# Patient Record
Sex: Female | Born: 2000 | Hispanic: No | Marital: Single | Smoking: Never smoker
Health system: Southern US, Community
[De-identification: ages and names within clinical notes are randomized; demographics above are authoritative.]

## PROBLEM LIST (undated history)

## (undated) DIAGNOSIS — J45909 Unspecified asthma, uncomplicated: Secondary | ICD-10-CM

## (undated) DIAGNOSIS — H9192 Unspecified hearing loss, left ear: Secondary | ICD-10-CM

## (undated) DIAGNOSIS — E669 Obesity, unspecified: Secondary | ICD-10-CM

## (undated) DIAGNOSIS — R51 Headache: Secondary | ICD-10-CM

## (undated) DIAGNOSIS — R519 Headache, unspecified: Secondary | ICD-10-CM

## (undated) DIAGNOSIS — J189 Pneumonia, unspecified organism: Secondary | ICD-10-CM

## (undated) DIAGNOSIS — S82899A Other fracture of unspecified lower leg, initial encounter for closed fracture: Secondary | ICD-10-CM

## (undated) HISTORY — PX: WISDOM TOOTH EXTRACTION: SHX21

---
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
## 2021-12-10 ENCOUNTER — Encounter (HOSPITAL_BASED_OUTPATIENT_CLINIC_OR_DEPARTMENT_OTHER): Payer: Self-pay

## 2021-12-10 ENCOUNTER — Emergency Department (HOSPITAL_BASED_OUTPATIENT_CLINIC_OR_DEPARTMENT_OTHER)
Admission: EM | Admit: 2021-12-10 | Discharge: 2021-12-10 | Disposition: A | Discharge status: Home / Self Care | Payer: Medicaid Other | Attending: Emergency Medicine | Admitting: Emergency Medicine

## 2021-12-10 ENCOUNTER — Other Ambulatory Visit: Payer: Self-pay

## 2021-12-10 DIAGNOSIS — L02412 Cutaneous abscess of left axilla: Secondary | ICD-10-CM | POA: Insufficient documentation

## 2021-12-10 DIAGNOSIS — L02411 Cutaneous abscess of right axilla: Secondary | ICD-10-CM

## 2021-12-10 MED ORDER — LIDOCAINE-EPINEPHRINE (PF) 2 %-1:200000 IJ SOLN
10.0000 mL | Freq: Once | INTRAMUSCULAR | Status: AC
  Administered 2021-12-10: 10 mL
  Filled 2021-12-10: qty 20

## 2021-12-10 MED ORDER — DOXYCYCLINE HYCLATE 100 MG PO CAPS: 100.0000 mg | ORAL_CAPSULE | Freq: Two times a day (BID) | ORAL | 0 refills | Status: DC

## 2021-12-10 NOTE — Discharge Instructions (Addendum)
You were seen in the emergency department for an abscess. ? ?I think you likely have a condition called hidradenitis suppurativa. I've attached some instructions about it. ? ?I am placing on a course of antibiotics, its important you take the entire course.  I have also sent a referral to the dermatologist, you should hear from them in the next several days.  If you do not, please give them a call and schedule an appointment yourself. ? ?You can take ibuprofen or Tylenol as needed for pain. ?

## 2021-12-10 NOTE — ED Provider Notes (Signed)
?[facility] EMERGENCY DEPARTMENT ?Provider Note ? ? ?CSN: [id number] ?Arrival date & time: 12/10/21  1707 ? ?  ? ?History ? ?Chief Complaint  ?Patient presents with  ? Abscess  ? ? ?Adriana Decker is a 21 y.o. female who presents the emergency department with axillary abscesses.  Patient states that she gets "boils" under both her armpits.  She notes the swelling under her left armpit is worse than the right, and was sent by her primary care doctor with concern for needing an incision and drainage.  She states that she has been having this boil for the past 3 days, and it has been draining.  No fever.  She states she has never seen a dermatologist for this. ? ? ?Abscess ?Associated symptoms: no fever   ? ?  ? ?Home Medications ?Prior to Admission medications   ?Medication Sig Start Date End Date Taking? Authorizing Provider  ?doxycycline (VIBRAMYCIN) 100 MG capsule Take 1 capsule (100 mg total) by mouth 2 (two) times daily. 12/10/21  Yes Carena Stream T, PA-C  ?acetaminophen (TYLENOL) 325 MG tablet Take 2 tablets (650 mg total) by mouth every 6 (six) hours as needed for mild pain or moderate pain. 11/21/16   Everlene Farrier, PA-C  ?aspirin EC 325 MG tablet Take 1 tablet (325 mg total) by mouth daily. 12/03/16   Yolonda Kida, MD  ?HYDROcodone-acetaminophen [facility]) 5-325 MG tablet Take 1-2 tablets by mouth every 6 (six) hours as needed for moderate pain. 12/03/16   Yolonda Kida, MD  ?naproxen (NAPROSYN) 250 MG tablet Take 1 tablet (250 mg total) by mouth 2 (two) times daily with a meal. 11/21/16   Everlene Farrier, PA-C  ?ondansetron (ZOFRAN ODT) 4 MG disintegrating tablet Take 1 tablet (4 mg total) by mouth every 8 (eight) hours as needed. 12/03/16   Yolonda Kida, MD  ?   ? ?Allergies    ?No known allergies   ? ?Review of Systems   ?Review of Systems  ?Constitutional:  Negative for fever.  ?Skin:   ?     Axillary abscesses  ?All other systems reviewed and are negative. ? ?Physical  Exam ?Updated Vital Signs ?BP 133/84   Pulse 76   Temp 98 ?F (36.7 ?C) (Oral)   Resp 16   Ht 5\' 5"  (1.651 m)   Wt 91.6 kg   LMP 12/03/2021 (Approximate)   SpO2 100%   BMI 33.61 kg/m?  ?Physical Exam ?Vitals and nursing note reviewed.  ?Constitutional:   ?   Appearance: Normal appearance.  ?HENT:  ?   Head: Normocephalic and atraumatic.  ?Eyes:  ?   Conjunctiva/sclera: Conjunctivae normal.  ?Pulmonary:  ?   Effort: Pulmonary effort is normal. No respiratory distress.  ?Skin: ?   General: Skin is warm and dry.  ?   Comments: Approximately 4 cm area of induration and fluctuance under the left axilla, with purulent drainage.  Approximately 6 cm area of induration under the right axilla without erythema, questionable for deeper abscess.  ?Neurological:  ?   Mental Status: She is alert.  ?Psychiatric:     ?   Mood and Affect: Mood normal.     ?   Behavior: Behavior normal.  ? ? ?ED Results / Procedures / Treatments   ?Labs ?(all labs ordered are listed, but only abnormal results are displayed) ?Labs Reviewed - No data to display ? ?EKG ?None ? ?Radiology ?No results found. ? ?Procedures ?12/05/2021.Incision and Drainage ? ?Date/Time: 12/10/2021 7:42 PM ?Performed  by: Su Monks, PA-C ?Authorized by: Su Monks, PA-C  ? ?Consent:  ?  Consent obtained:  Verbal ?  Consent given by:  Patient ?  Risks discussed:  Incomplete drainage, pain and infection ?Universal protocol:  ?  Patient identity confirmed:  Verbally with patient ?Location:  ?  Type:  Abscess ?  Size:  4 cm ?  Location:  Upper extremity ?  Upper extremity location: Axilla. ?Pre-procedure details:  ?  Skin preparation:  Povidone-iodine ?Anesthesia:  ?  Anesthesia method:  Local infiltration ?  Local anesthetic:  Lidocaine 2% WITH epi ?Procedure type:  ?  Complexity:  Simple ?Procedure details:  ?  Needle aspiration: yes   ?  Needle size:  25 G ?  Incision types:  Stab incision ?  Wound management:  Probed and deloculated and irrigated with saline ?   Drainage:  Bloody ?  Drainage amount:  Scant ?  Wound treatment:  Wound left open ?  Packing materials:  None ?Post-procedure details:  ?  Procedure completion:  Tolerated well, no immediate complications  ? ? ?Medications Ordered in ED ?Medications  ?lidocaine-EPINEPHrine (XYLOCAINE W/EPI) 2 %-1:200000 (PF) injection 10 mL (10 mLs Infiltration Given 12/10/21 1853)  ? ? ?ED Course/ Medical Decision Making/ A&P ?  ?                        ?Medical Decision Making ?Risk ?Prescription drug management. ? ? ?This patient is a 21 year old female who presents to the ED for concern of axillary abscesses.  ? ?Differential diagnoses prior to evaluation: ?Cellulitis, abscess, hidradenitis suppurativa ? ?Past Medical History / Co-morbidities / Social History: ?Noncontributory ? ?Additional history: ?Chart reviewed.  ? ?Physical Exam: ?Physical exam performed. The pertinent findings include: 4 cm area of induration and fluctuance under the left axilla, with purulent drainage ? ?Procedure: ?Abscess was drained, with scant bloody drainage.  Patient likely had most purulent drainage and the abscess opened on its own.  Patient tolerated procedure well with no immediate complications.  We will place patient on antibiotics. ?  ?Disposition: ?After consideration of the diagnostic results and the patients response to treatment, I feel that patient's current mission inpatient treatment.  Multiple abscesses consistent with hidradenitis suppurativa.  Will discharge patient on antibiotics and provide dermatology referral.  Provided education about wound care post I&D.  Discussed reasons to return to the emergency department, and the patient is agreeable to the plan.  ? ? ? ? ? ? ? ?Final Clinical Impression(s) / ED Diagnoses ?Final diagnoses:  ?Abscess of axilla, left  ?Abscesses of both axillae  ? ? ?Rx / DC Orders ?ED Discharge Orders   ? ?      Ordered  ?  doxycycline (VIBRAMYCIN) 100 MG capsule  2 times daily       ? 12/10/21 1934  ?   Ambulatory referral to Dermatology       ?Comments: Follow up I&D left axillary abscess, concerning for hidradenitis suppurativa  ? 12/10/21 1934  ? ?  ?  ? ?  ? ?Portions of this report may have been transcribed using voice recognition software. Every effort was made to ensure accuracy; however, inadvertent computerized transcription errors may be present. ? ?  ?Su Monks, PA-C ?12/10/21 1943 ? ?  ?Glynn Octave, MD ?12/10/21 2333 ? ?

## 2021-12-10 NOTE — ED Notes (Signed)
Abscess under left axilla, pt states she it has been there 3 days +drainage ?

## 2021-12-10 NOTE — ED Triage Notes (Signed)
States she gets "boils" under her armpits. Swelling noted under left axilla worse than right. Was sent by PCP. ?

## 2021-12-10 NOTE — ED Notes (Signed)
NIH documented on wrong client ?

## 2021-12-11 ENCOUNTER — Telehealth: Payer: Self-pay | Admitting: Dermatology

## 2021-12-11 NOTE — Telephone Encounter (Signed)
This referral was already routed back to referring office because patient has Medicaid. ?

## 2021-12-11 NOTE — Telephone Encounter (Signed)
Please ask referrer to try to get her in elsewhere before December ?

## 2023-06-18 ENCOUNTER — Other Ambulatory Visit: Payer: Self-pay

## 2023-06-18 ENCOUNTER — Ambulatory Visit (INDEPENDENT_AMBULATORY_CARE_PROVIDER_SITE_OTHER): Payer: Medicaid Other

## 2023-06-18 ENCOUNTER — Other Ambulatory Visit (HOSPITAL_COMMUNITY)
Admission: RE | Admit: 2023-06-18 | Discharge: 2023-06-18 | Disposition: A | Discharge status: Home / Self Care | Payer: Medicaid Other | Source: Ambulatory Visit | Attending: Obstetrics and Gynecology | Admitting: Obstetrics and Gynecology

## 2023-06-18 VITALS — BP 130/70 | HR 77 | Wt 217.4 lb

## 2023-06-18 DIAGNOSIS — Z3A10 10 weeks gestation of pregnancy: Secondary | ICD-10-CM

## 2023-06-18 DIAGNOSIS — F430 Acute stress reaction: Secondary | ICD-10-CM

## 2023-06-18 DIAGNOSIS — Z3401 Encounter for supervision of normal first pregnancy, first trimester: Secondary | ICD-10-CM | POA: Insufficient documentation

## 2023-06-18 DIAGNOSIS — Z1339 Encounter for screening examination for other mental health and behavioral disorders: Secondary | ICD-10-CM

## 2023-06-18 MED ORDER — GOJJI WEIGHT SCALE MISC: 1.0000 | 0 refills | Status: AC

## 2023-06-18 MED ORDER — BLOOD PRESSURE KIT DEVI: 1.0000 | 0 refills | Status: AC

## 2023-06-18 MED ORDER — CITRANATAL B-CALM 20-1 MG & 2 X 25 MG PO MISC: 1.0000 | Freq: Every day | ORAL | 12 refills | Status: DC

## 2023-06-18 NOTE — Progress Notes (Signed)
New OB Intake  I connected with Adriana Decker  on 06/18/23 at 10:15 AM EST by in person and verified that I am speaking with the correct person using two identifiers. Nurse is located at [facility] and pt is located at [place].  I discussed the limitations, risks, security and privacy concerns of performing an evaluation and management service by telephone and the availability of in person appointments. I also discussed with the patient that there may be a patient responsible charge related to this service. The patient expressed understanding and agreed to proceed.  I explained I am completing New OB Intake today. We discussed EDD 01/14/24 based on LMP 04/09/23.Marland Kitchen Pt is No obstetric history on file.. I reviewed her allergies, medications and Medical/Surgical/OB history.    Patient Active Problem List   Diagnosis Date Noted   Ankle syndesmosis disruption, right, initial encounter 06/22/2017   Bimalleolar ankle fracture, right, closed, initial encounter 12/03/2016   Closed right ankle fracture 12/03/2016    Concerns addressed today  Delivery Plans Plans to deliver at [facility]. Discussed the nature of our practice with multiple providers including residents and students. Due to the size of the practice, the delivering provider may not be the same as those providing prenatal care.   Patient is interested in water birth. Offered upcoming OB visit with CNM to discuss further.  MyChart/Babyscripts MyChart access verified. I explained pt will have some visits in office and some virtually. Babyscripts instructions given and order placed. Patient verifies receipt of registration text/e-mail. Account successfully created and app downloaded.  Blood Pressure Cuff/Weight Scale Blood pressure cuff ordered for patient to pick-up from [employer]. Explained after first prenatal appt pt will check weekly and document in Babyscripts. Patient does not have weight scale; order sent to [facility], patient  may track weight weekly in Babyscripts.  Anatomy US Explained first scheduled [place] will be around 19 weeks. Anatomy US scheduled for TBD.  Is patient a CenteringPregnancy candidate?  Declined Declined due to Group setting Not a candidate due to  Declined If accepted,    Interested in Doula? If yes, send referral and doula dot phrase.   Is patient a candidate for Babyscripts Optimization? No - Not interested  First visit review I reviewed new OB appt with patient. Explained pt will be seen by TBD at first visit. Discussed Avelina Laine genetic screening with patient. Interested in [place] [facility] [employer]. Drawn today. Routine prenatal labs is collected today   Last Pap No results found for: "DIAGPAP"  Hamilton Capri, RN 06/18/2023  10:26 AM

## 2023-06-19 LAB — CBC/D/PLT+RPR+RH+ABO+RUBIGG...
Antibody Screen: NEGATIVE
Basophils Absolute: 0.1 10*3/uL (ref 0.0–0.2)
Basos: 1 %
EOS (ABSOLUTE): 0.5 10*3/uL — ABNORMAL HIGH (ref 0.0–0.4)
Eos: 5 %
HCV Ab: NONREACTIVE
HIV Screen 4th Generation wRfx: NONREACTIVE
Hematocrit: 34.9 % (ref 34.0–46.6)
Hemoglobin: 10.2 g/dL — ABNORMAL LOW (ref 11.1–15.9)
Hepatitis B Surface Ag: NEGATIVE
Immature Grans (Abs): 0 10*3/uL (ref 0.0–0.1)
Immature Granulocytes: 0 %
Lymphocytes Absolute: 2.6 10*3/uL (ref 0.7–3.1)
Lymphs: 24 %
MCH: 20.8 pg — ABNORMAL LOW (ref 26.6–33.0)
MCHC: 29.2 g/dL — ABNORMAL LOW (ref 31.5–35.7)
MCV: 71 fL — ABNORMAL LOW (ref 79–97)
Monocytes Absolute: 0.5 10*3/uL (ref 0.1–0.9)
Monocytes: 5 %
Neutrophils Absolute: 7 10*3/uL (ref 1.4–7.0)
Neutrophils: 65 %
Platelets: 397 10*3/uL (ref 150–450)
RBC: 4.9 x10E6/uL (ref 3.77–5.28)
RDW: 18 % — ABNORMAL HIGH (ref 11.7–15.4)
RPR Ser Ql: NONREACTIVE
Rh Factor: POSITIVE
Rubella Antibodies, IGG: 2.55 {index} (ref >0.99)
WBC: 10.5 10*3/uL (ref 3.4–10.8)

## 2023-06-19 LAB — CERVICOVAGINAL ANCILLARY ONLY
Chlamydia: NEGATIVE
Comment: NEGATIVE
Comment: NEGATIVE
Comment: NORMAL
Neisseria Gonorrhea: NEGATIVE
Trichomonas: NEGATIVE

## 2023-06-19 LAB — HCV INTERPRETATION

## 2023-06-20 LAB — URINE CULTURE, OB REFLEX

## 2023-06-20 LAB — CULTURE, OB URINE

## 2023-06-27 LAB — HORIZON CUSTOM: REPORT SUMMARY: NEGATIVE

## 2023-06-27 LAB — PANORAMA PRENATAL TEST FULL PANEL:PANORAMA TEST PLUS 5 ADDITIONAL MICRODELETIONS: FETAL FRACTION: 2.7

## 2023-06-30 ENCOUNTER — Other Ambulatory Visit: Payer: Self-pay | Admitting: *Deleted

## 2023-06-30 DIAGNOSIS — Z3401 Encounter for supervision of normal first pregnancy, first trimester: Secondary | ICD-10-CM

## 2023-06-30 MED ORDER — PREPLUS 27-1 MG PO TABS: 1.0000 | ORAL_TABLET | Freq: Every day | ORAL | 13 refills | Status: DC

## 2023-06-30 NOTE — Progress Notes (Signed)
Pharmacy request alternate PNV, original RX not available. New RX sent.

## 2023-07-14 ENCOUNTER — Other Ambulatory Visit (HOSPITAL_COMMUNITY)
Admission: RE | Admit: 2023-07-14 | Discharge: 2023-07-14 | Disposition: A | Discharge status: Home / Self Care | Payer: Medicaid Other | Source: Ambulatory Visit | Attending: Family Medicine | Admitting: Family Medicine

## 2023-07-14 ENCOUNTER — Encounter: Payer: Self-pay | Admitting: Family Medicine

## 2023-07-14 ENCOUNTER — Ambulatory Visit: Payer: Medicaid Other | Admitting: Licensed Clinical Social Worker

## 2023-07-14 ENCOUNTER — Ambulatory Visit: Payer: Medicaid Other | Admitting: Family Medicine

## 2023-07-14 VITALS — BP 127/71 | HR 77 | Wt 216.0 lb

## 2023-07-14 DIAGNOSIS — F4322 Adjustment disorder with anxiety: Secondary | ICD-10-CM

## 2023-07-14 DIAGNOSIS — Z3A13 13 weeks gestation of pregnancy: Secondary | ICD-10-CM | POA: Diagnosis not present

## 2023-07-14 DIAGNOSIS — Z3401 Encounter for supervision of normal first pregnancy, first trimester: Secondary | ICD-10-CM | POA: Insufficient documentation

## 2023-07-14 DIAGNOSIS — Z3400 Encounter for supervision of normal first pregnancy, unspecified trimester: Secondary | ICD-10-CM

## 2023-07-14 DIAGNOSIS — Z6833 Body mass index (BMI) 33.0-33.9, adult: Secondary | ICD-10-CM

## 2023-07-14 DIAGNOSIS — O219 Vomiting of pregnancy, unspecified: Secondary | ICD-10-CM

## 2023-07-14 MED ORDER — ASPIRIN 81 MG PO TBEC: 81.0000 mg | DELAYED_RELEASE_TABLET | Freq: Every day | ORAL | 12 refills | Status: DC

## 2023-07-14 MED ORDER — ONDANSETRON HCL 4 MG PO TABS: 4.0000 mg | ORAL_TABLET | Freq: Three times a day (TID) | ORAL | 0 refills | Status: AC | PRN

## 2023-07-14 NOTE — Progress Notes (Signed)
Pt presents for ROB visit. No PAP Hx.

## 2023-07-14 NOTE — Progress Notes (Signed)
New OB Note  07/14/2023   Clinic: [facility]  Transfer of Care Patient: no  History of Present Illness: Adriana Decker is a 22 y.o. G1P0 at [redacted]w[redacted]d weeks (dating based on LMP).  Preg complicated by has Encounter for supervision of normal first pregnancy in first trimester on their problem list.   Support at home: Partner Any events prior to today's visit: no Her periods were: irregular periods -- would be monthly, then would switch timing of the month She was using condoms when she conceived.  She has Positive signs or symptoms of nausea/vomiting of pregnancy. She has Negative signs or symptoms of miscarriage or preterm labor On any medications around the time she conceived/early pregnancy: No   ROS: A 12-point review of systems was performed and negative, except as stated in the above HPI.  OB History  Gravida Para Term Preterm AB Living  1            SAB IAB Ectopic Multiple Live Births               # Outcome Date GA Lbr Len/2nd Weight Sex Type Anes PTL Lv  1 Current             Any issues with any prior pregnancies: not applicable Prior children are healthy, doing well, and without any problems or issues: not applicable History of pap smears: No. Never had pap before. History of STIs: No   Past Medical History: Past Medical History:  Diagnosis Date   Ankle fracture    right   Asthma    " as a baby"   Headache    Hearing deficit, left    Obesity    Pneumonia    " as a baby"    Past Surgical History: Past Surgical History:  Procedure Laterality Date   ORIF ANKLE FRACTURE Right 12/03/2016   [facility]   ORIF ANKLE FRACTURE Right 12/03/2016   Procedure: OPEN REDUCTION INTERNAL FIXATION (ORIF) ANKLE FRACTURE;  Surgeon: Yolonda Kida, MD;  Location: [facility] OR;  Service: Orthopedics;  Laterality: Right;   WISDOM TOOTH EXTRACTION      Family History:  Family History  Problem Relation Age of Onset   High blood pressure Mother    Asthma Brother    Asthma  Brother    Hypertension Maternal Grandmother    Hypertension Maternal Grandfather    Hypertension Paternal Grandmother    Asthma Paternal Grandmother    She denies any female cancers, bleeding or blood clotting disorders.  She denies any history of mental retardation, birth defects or genetic disorders in her or the FOB's history  Social History:  Social History   Socioeconomic History   Marital status: Single    Spouse name: Not on file   Number of children: Not on file   Years of education: Not on file   Highest education level: Not on file  Occupational History   Not on file  Tobacco Use   Smoking status: Never   Smokeless tobacco: Never  Vaping Use   Vaping status: Former   Devices: not since confirmed pregnancy  Substance and Sexual Activity   Alcohol use: Not Currently    Comment: not since confirmed pregnancy   Drug use: No   Sexual activity: Yes    Partners: Male    Comment: currently pregnant  Other Topics Concern   Not on file  Social History Narrative   Pt is in the 10 th grade and lives at home with  both parents and siblings.   Patient has 67 yo brother amd 30 yo brother. No smokers in the home. 1 cat sometimes stays in the home.   Social Determinants of Health   Financial Resource Strain: Not on file  Food Insecurity: Not on file  Transportation Needs: Not on file  Physical Activity: Not on file  Stress: Not on file  Social Connections: Unknown (12/25/2021)   Received from [facility]   Social Network    Social Network: Not on file  Intimate Partner Violence: Unknown (11/16/2021)   Received from [facility]   HITS    Physically Hurt: Not on file    Insult or Talk Down To: Not on file    Threaten Physical Harm: Not on file    Scream or Curse: Not on file   Any pets in the household: yes -- has a dog at home  Allergy: Allergies  Allergen Reactions   No Known Allergies     Health Maintenance:  Mammogram Up to Date: not  applicable  Current Outpatient Medications:  Current Outpatient Medications  Medication Sig Dispense Refill   aspirin EC 81 MG tablet Take 1 tablet (81 mg total) by mouth daily. Swallow whole. 30 tablet 12   Blood Pressure Monitoring (BLOOD PRESSURE KIT) DEVI 1 kit by Does not apply route once a week. 1 each 0   Misc. Devices (GOJJI WEIGHT SCALE) MISC 1 Device by Does not apply route every 30 (thirty) days. 1 each 0   ondansetron (ZOFRAN) 4 MG tablet Take 1 tablet (4 mg total) by mouth every 8 (eight) hours as needed for nausea or vomiting. 20 tablet 0   Prenat w/o A FeCbnFeGlu-FA &B6 (CITRANATAL B-CALM) 20-1 MG & 2 x 25 MG MISC Take 1 tablet by mouth daily. 30 each 12   No current facility-administered medications for this visit.     Physical Exam:   BP 127/71   Pulse 77   Wt 216 lb (98 kg)   LMP 04/09/2023   BMI 35.94 kg/m  Body mass index is 35.94 kg/m. Contractions: Not present Vag. Bleeding: None. Fundal height: not applicable FHTs: 166  General appearance: Well nourished, well developed female in no acute distress.  Neck:  Supple, normal appearance, and no thyromegaly  Cardiovascular: S1, S2 normal, no murmur, rub or gallop, regular rate and rhythm Respiratory:  Clear to auscultation bilateral. Normal respiratory effort Abdomen: positive bowel sounds and no masses, hernias; diffusely non tender to palpation, non distended Neuro/Psych:  Normal mood and affect.  Skin:  Warm and dry.  Lymphatic:  No inguinal lymphadenopathy.   Pelvic exam: is not limited by body habitus EGBUS: within normal limits, Vagina: within normal limits and with no blood in the vault, Cervix: normal appearing cervix without discharge or lesions, closed/long/high  Assessment/Plan:  Encounter for supervision of normal first pregnancy in first trimester Prenatal course reviewed BP, HR, FHR normal Discussed expected weight gain, rec for exercise, foods to avoid, typical prenatal appt  schedule  Primiparous, antepartum  BMI 33.0-33.9,adult Start ldASA  Nausea/vomiting in pregnancy Rx for Zofran sent   The nature of [facility] with multiple MDs and other Advanced Practice Providers was explained to patient; also emphasized that residents, students are part of our team.  Return in about 4 weeks (around 08/11/2023) for Routine prenatal care.  No future appointments.  Adriana Aland, MD OB Fellow, Faculty Practice [facility], Center for [facility]

## 2023-07-14 NOTE — BH Specialist Note (Signed)
Integrated Behavioral Health Initial In-Person Visit  MRN: [id number] Name: Adriana Decker  Number of Integrated Behavioral Health Clinician visits: No data recorded Session Start time: No data recorded   3:17 PM  Session End time: No data recorded Total time in minutes: No data recorded  Types of Service: {CHL AMB TYPE OF SERVICE:[phone]}  Interpretor:{yes [id number]} Interpretor Name and Language: ***   Warm Hand Off Completed.        Subjective: Adriana Decker is a 22 y.o. female accompanied by {CHL AMB ACCOMPANIED ZH:[id number]} Patient was referred by *** for ***. Patient reports the following symptoms/concerns: *** Duration of problem: ***; Severity of problem: {Mild/Moderate/Severe:20260}  Objective: Mood: Anxious and Affect: Appropriate Risk of harm to self or others: No plan to harm self or others  Life Context: Family and Social: Patient lives with parents.  School/Work: Patient is currently unemployed.. Self-Care: Patient likes physical activity.  Life Changes: Patient is currently pregnant.   Patient and/or Family's Strengths/Protective Factors: {CHL AMB BH PROTECTIVE FACTORS:[id number]}  Goals Addressed: Patient will: Reduce symptoms of: {IBH Symptoms:21014056} Increase knowledge and/or ability of: {IBH Patient Tools:21014057}  Demonstrate ability to: {IBH Goals:21014053}  Progress towards Goals: {CHL AMB BH PROGRESS TOWARDS [phone]}  Interventions: Interventions utilized: {IBH Interventions:21014054}  Standardized Assessments completed: {IBH Screening Tools:21014051}  Patient and/or Family Response:   Boyfriend. [name]..  Culturally, girls have more responsible.. Boys have an easier life..   Living up to family's perfection while pregnant..   Parents made her quit her job.. Told her to quit her job and they would take care of her her..   Neasious a lot due to the foods she was eating.. some foods she used to eat can't eat  anymore.   Feels like she's not made for babies. Wish she was better prepared..   A really big problem if she would keep working..    Patient Centered Plan: Patient is on the following Treatment Plan(s):  ***  Assessment: Patient currently experiencing ***.   Patient may benefit from ***.  Plan: Follow up with behavioral health clinician on : *** Behavioral recommendations: boundaries, out of house.  Referral(s): {IBH Referrals:21014055} "From scale of 1-10, how likely are you to follow plan?": ***  Duey Liller L Cedric Fishman, LCSWA

## 2023-07-14 NOTE — Patient Instructions (Signed)
Dos and Don'ts in Pregnancy  1. Prenatal Vitamins Pregnant women should consume the following each day through diet or supplements: o Folic acid 400-800 micrograms  o Iron 30 mg (or be screened for anemia) o Vitamin D 600 international units o Calcium 1,000 mg Prenatal vitamins are unlikely to be harmful. Therefore, they may be used to ensure adequate consumption of several vitamins and minerals in pregnancy. However, their necessity for all pregnant women is uncertain, especially for women with well-balanced diets. There is no known ideal formulation for a prenatal vitamin.  2. Nutrition and Weight Gain Pregnant women should eat a healthy, well-balanced diet and typically should increase their caloric intake by a small amount (350-450 calories/d). Typical weight gain goals vary based on pre-pregnancy body mass index (BMI)  3. Alcohol The exact threshold between safe and unsafe consumption of alcohol is unknown. Therefore, alcohol should be avoided in pregnancy.  4. Artificial Sweeteners Artificial sweeteners can be used in pregnancy. Data is conflicting. Low (typical) consumption of saccharin is likely safe.  5. Caffeine Low-to-moderate caffeine intake in pregnancy does not appear to be associated with any adverse outcomes. Pregnant women may have caffeine but should probably limit it to less than 300 mg/d (a typical 8-ounce cup of brewed coffee has approximately 130 mg of caffeine. An 8-ounce cup of tea or 12-ounce soda has approximately 50 mg of caffeine), but exact amounts vary based on the specific beverage or food.  6. Fish Consumption Pregnant women should try to consume two to three servings per week of fish with a high DHA (docosahexaenoic acid) and low mercury content. In line with current recommendations, pregnant women should generally avoid undercooked fish. However, sushi that was prepared in a clean and reputable establishment is unlikely to pose a risk to the  pregnancy.  7. Other Foods to Avoid Pregnant women should avoid raw and undercooked meat. Pregnant women should wash vegetables and fruit before eating them. Pregnant women should avoid unpasteurized dairy products. Unheated deli meats could also potentially increase the risk of Listeria, but the risk in recent years in uncertain. Pregnant women should avoid foods that are being recalled for possible Listeria contamination.  8. Smoking, Nicotine, and Vaping Women should not smoke cigarettes during pregnancy. If they are unable to quit entirely, they should reduce it as much as possible. Nicotine replacement (with patches or gum) is appropriate as part of a smoking cessation strategy.  9. Marijuana Marijuana use is not known to be associated with any adverse outcomes in pregnancy. However, data regarding long-term neurodevelopmental outcomes are lacking; therefore, marijuana use is currently not recommended in pregnancy.  10. Exercise and Bedrest Pregnant women are encouraged to exercise regularly. There is no known benefit to activity restriction or bedrest for pregnant women.  11. Avoiding Injury Pregnant women should wear lap and shoulder seatbelts while in a motor vehicle and should not disable their airbags.  12. Oral Health Oral health and dental procedures can continue as scheduled during pregnancy.  13. Hot Tubs and Swimming Although data are limited, pregnant women should probably avoid hot tub use in the first trimester. Swimming pool use should not be discouraged in pregnancy.  14. Insect Repellants Topical insect repellants (including DEET) can be used in pregnancy and should be used in areas with high risk for insect-borne illnesses.  15. Hair Dyes Although data are limited, because systemic absorption is minimal, hair dye is presumed to be safe in pregnancy.  16. Travel Airline travel is safe in pregnancy.  Pregnant women should be familiar with the infection  exposures and available medical care for each specific destination. There is no exact gestational age at which women must stop travel. Each pregnant woman must balance the benefit of the trip with the potential of a complication at her destination.  17. Sexual Intercourse Pregnant women without bleeding, placenta previa at greater than 20 weeks of gestation, or ruptured membranes should not have restrictions regarding sexual intercourse.  18. Sleeping Position It is currently unknown whether, and at what gestational age, pregnant women should be advised to sleep on their side.  Source: Joannie Springs MD. Dos and Don'ts in Pregnancy: Truths and Myths. Obstetrics & Gynecology 131(4):p 409-811, April 2018.  DOI: 10.1097/AOG.9147829562130865   Safe Medications in Pregnancy   Acne: Benzoyl Peroxide Salicylic Acid  Backache/Headache: Tylenol: 2 regular strength every 4 hours OR              2 Extra strength every 6 hours  Colds/Coughs/Allergies: Benadryl (alcohol free) 25 mg every 6 hours as needed Breath right strips Claritin Cepacol throat lozenges Chloraseptic throat spray Cold-Eeze- up to three times per day Cough drops, alcohol free Flonase (by prescription only) Guaifenesin Mucinex Robitussin DM (plain only, alcohol free) Saline nasal spray/drops Sudafed (pseudoephedrine) & Actifed ** use only after [redacted] weeks gestation and if you do not have high blood pressure Tylenol Vicks Vaporub Zinc lozenges Zyrtec   Constipation: Colace Ducolax suppositories Fleet enema Glycerin suppositories Metamucil Milk of magnesia Miralax Senokot Smooth move tea  Diarrhea: Kaopectate Imodium A-D  *NO pepto Bismol  Hemorrhoids: Anusol Anusol HC Preparation H Tucks  Indigestion: Tums Maalox Mylanta Cimetidine (Tagamet HB)** preferred in pregnancy Famotidine (Pepcid) Ranitidine (Zantac)  Insomnia: Benadryl (alcohol free) 25mg  every 6 hours as needed Tylenol PM Unisom,  no Gelcaps  Leg Cramps: Tums MagGel  Nausea/Vomiting:  Bonine Dramamine Emetrol Ginger extract Sea bands Meclizine   Nausea medication to take during pregnancy:  Unisom (doxylamine succinate 25 mg tablets) Take one tablet daily at bedtime. If symptoms are not adequately controlled, the dose can be increased to a maximum recommended dose of two tablets daily (1/2 tablet in the morning, 1/2 tablet mid-afternoon and one at bedtime). Vitamin B6 100mg  tablets. Take one tablet twice a day (up to 200 mg per day).  Skin Rashes: Aveeno products Benadryl cream or 25mg  every 6 hours as needed Calamine Lotion 1% cortisone cream  Yeast infection: Gyne-lotrimin 7 Monistat 7   **If taking multiple medications, please check labels to avoid duplicating the same active ingredients **take medication as directed on the label ** Do not exceed 4000 mg of tylenol in 24 hours **Do not take medications that contain aspirin or ibuprofen

## 2023-07-16 LAB — CYTOLOGY - PAP: Diagnosis: NEGATIVE

## 2023-07-19 LAB — PANORAMA PRENATAL TEST FULL PANEL:PANORAMA TEST PLUS 5 ADDITIONAL MICRODELETIONS: FETAL FRACTION: 5.3

## 2023-07-19 NOTE — Progress Notes (Signed)
NIPT LR female

## 2023-07-22 ENCOUNTER — Encounter: Payer: Self-pay | Admitting: Obstetrics and Gynecology

## 2023-07-30 ENCOUNTER — Ambulatory Visit (INDEPENDENT_AMBULATORY_CARE_PROVIDER_SITE_OTHER): Payer: Medicaid Other | Admitting: Licensed Clinical Social Worker

## 2023-07-30 DIAGNOSIS — F4322 Adjustment disorder with anxiety: Secondary | ICD-10-CM | POA: Diagnosis not present

## 2023-07-30 NOTE — BH Specialist Note (Signed)
Integrated Behavioral Health Follow Up In-Person Visit  MRN: [id number] Name: Adriana Decker  Number of Integrated Behavioral Health Clinician visits: 2- Second Visit  Session Start time: 1437  Session End time: 1523  Total time in minutes: 46   Types of Service: Individual psychotherapy  Interpretor:No. Interpretor Name and Language: None   Subjective: Adriana Decker is a 22 y.o. female accompanied by  Fiance who sat in the waiting area. Patient was referred by Angelica Pou for Stress. Patient reports the following symptoms/concerns: Recent proposal, continued family conflict.  Duration of problem: Months; Severity of problem: moderate  Objective: Mood: Anxious and Affect: Appropriate Risk of harm to self or others: No plan to harm self or others  Life Context: Family and Social: Patient lives with parents.  School/Work:  Patient is currently unemployed..  Self-Care:  Patient likes physical activity.  Life Changes: Patient is currently pregnant with first baby.   Patient and/or Family's Strengths/Protective Factors: Social connections, Social and Patent attorney, Concrete supports in place (healthy food, safe environments, etc.), and Physical Health (exercise, healthy diet, medication compliance, etc.)  Goals Addressed: Patient will:  Reduce symptoms of: anxiety and stress   Increase knowledge and/or ability of: coping skills, healthy habits, and stress reduction   Demonstrate ability to: Increase healthy adjustment to current life circumstances  Progress towards Goals: Ongoing  Interventions: Interventions utilized:  Mindfulness or Management consultant, Supportive Counseling, Psychoeducation and/or Health Education, and Supportive Reflection Standardized Assessments completed: Not Needed  Patient and/or Family Response: Patient was present for today's session and discussed ongoing family stressors, particularly difficulties in setting boundaries with her  parents.  Parent explored past experiences with her parents, including feelings of hurt stemming from being forced to maintain relationships with family members who caused her emotional pain. She expressed a desire to prevent similar experiences for her child, emphasizing the importance of setting healthy boundaries. Together, patient and [facility] processed strategies for establishing appropriate boundaries for both herself and her baby.  Patient also shared her recent engagement and proposal to her fiance, expressing excitement about their future together. She described being in a positive space with her fiance and feeling optimistic about their relationship.   Patient Centered Plan: Patient is on the following Treatment Plan(s): Stress  Assessment: Patient currently navigating ongoing family stressors, particularly related to difficulties in setting boundaries with her parents and processing past experiences of emotional hurt caused by family dynamics. She is motivated to create healthier boundaries to protect both herself and her baby from similar challenges. Additionally, she is experiencing excitement and optimism about her recent engagement and her future with her fiance.   Patient may benefit from continued support of this clinic to support healthy adjustment and to implement/gain positive coping mechanisms to reduce stress.   Plan: Follow up with behavioral health clinician on : 08/18/2023 Behavioral recommendations: Continue creating healthy and appropriate boundaries with your parents. Remember, it's okay not to over share. Try making small decisions for yourself without asking or telling people who are not supportive.  Referral(s): Integrated [employer] (In Clinic) "From scale of 1-10, how likely are you to follow plan?": Patient agreeable to above plan.   Donyale Falcon Cruzita Lederer, LCSWA

## 2023-08-12 ENCOUNTER — Ambulatory Visit (INDEPENDENT_AMBULATORY_CARE_PROVIDER_SITE_OTHER): Payer: Medicaid Other | Admitting: Obstetrics and Gynecology

## 2023-08-12 VITALS — BP 106/61 | HR 73 | Wt 216.4 lb

## 2023-08-12 DIAGNOSIS — Z3A17 17 weeks gestation of pregnancy: Secondary | ICD-10-CM

## 2023-08-12 DIAGNOSIS — Z3401 Encounter for supervision of normal first pregnancy, first trimester: Secondary | ICD-10-CM

## 2023-08-12 NOTE — Progress Notes (Signed)
   PRENATAL VISIT NOTE  Subjective:  Adriana Decker is a 22 y.o. G1P0 at [redacted]w[redacted]d being seen today for ongoing prenatal care.  She is currently monitored for the following issues for this low-risk pregnancy and has Encounter for supervision of normal first pregnancy in first trimester on their problem list.  Patient reports  back pain, intermittent  .  Contractions: Irritability. Vag. Bleeding: None.  Movement: Present. Denies leaking of fluid.   The following portions of the patient's history were reviewed and updated as appropriate: allergies, current medications, past family history, past medical history, past social history, past surgical history and problem list.   Objective:   Vitals:   08/12/23 1540  BP: 106/61  Pulse: 73  Weight: 216 lb 6.4 oz (98.2 kg)    Fetal Status: Fetal Heart Rate (bpm): 152   Movement: Present     General:  Alert, oriented and cooperative. Patient is in no acute distress.  Skin: Skin is warm and dry. No rash noted.   Cardiovascular: Normal heart rate noted  Respiratory: Normal respiratory effort, no problems with respiration noted  Abdomen: Soft, gravid, appropriate for gestational age.  Pain/Pressure: Present     Pelvic: Cervical exam deferred        Extremities: Normal range of motion.  Edema: None  Mental Status: Normal mood and affect. Normal behavior. Normal judgment and thought content.   Assessment and Plan:  Pregnancy: G1P0 at [redacted]w[redacted]d 1. Encounter for supervision of normal first pregnancy in first trimester (Primary) BP and FHR normal Doing well, starting to feel some movement   2. [redacted] weeks gestation of pregnancy Supportive measures regarding back pain including, heat, tylenol , stretches  Peds list given today  Anatomy scan 1/8 - AFP, Serum, Open Spina Bifida  Preterm labor symptoms and general obstetric precautions including but not limited to vaginal bleeding, contractions, leaking of fluid and fetal movement were reviewed in detail  with the patient. Please refer to After Visit Summary for other counseling recommendations.   Return in 4 weeks for routine prenatal   Future Appointments  Date Time Provider Department Center  08/18/2023  2:00 PM Sherrine Marcelyn [place], [place] [facility] None  08/20/2023  1:15 PM [facility] NURSE [facility]  08/20/2023  1:30 PM [facility] US1 [facility]  09/08/2023  1:30 PM [place], [place] LABOR, CNM [facility] None    Nidia Daring, FNP

## 2023-08-12 NOTE — Patient Instructions (Signed)

## 2023-08-15 LAB — AFP, SERUM, OPEN SPINA BIFIDA
AFP MoM: 0.74
AFP Value: 22.3 ng/mL
Gest. Age on Collection Date: 17 wk
Maternal Age At EDD: 22.8 a
OSBR Risk 1 IN: 10000
Test Results:: NEGATIVE
Weight: 216 [lb_av]

## 2023-08-18 ENCOUNTER — Ambulatory Visit: Payer: Medicaid Other | Admitting: Licensed Clinical Social Worker

## 2023-08-18 DIAGNOSIS — F4322 Adjustment disorder with anxiety: Secondary | ICD-10-CM

## 2023-08-18 NOTE — BH Specialist Note (Signed)
 Integrated Behavioral Health Follow Up In-Person Visit  MRN: [id number] Name: Adriana Decker  Number of Integrated Behavioral Health Clinician visits: 1- Initial Visit  Session Start time: 1410  Session End time: 1449  Total time in minutes: 39   Types of Service: Individual psychotherapy  Interpretor:No. Interpretor Name and Language: none   Subjective: Adriana Decker is a 23 y.o. female accompanied by Partner/Significant Other who sat in the waiting area.  Patient was referred by Derrek Freund for stress. Patient reports the following symptoms/concerns: continued difficulty with boundary setting and family conflict.  Duration of problem: Months; Severity of problem: moderate  Objective: Mood: Depressed and Affect: Appropriate Risk of harm to self or others: No plan to harm self or others  Life Context: Family and Social: Patient lives with parents.  School/Work:Patient is currently unemployed.   Self-Care:Patient likes physical activity.   Life Changes: Patient is currently pregnant with first baby. Recently engaged.   Patient and/or Family's Strengths/Protective Factors: Social connections, Social and Patent attorney, Concrete supports in place (healthy food, safe environments, etc.), and Physical Health (exercise, healthy diet, medication compliance, etc.)  Goals Addressed: Patient will:  Reduce symptoms of: anxiety and stress   Increase knowledge and/or ability of: coping skills, healthy habits, and stress reduction   Demonstrate ability to: Increase healthy adjustment to current life circumstances  Progress towards Goals: Ongoing  Interventions: Interventions utilized:  Mindfulness or Management Consultant, Supportive Counseling, Psychoeducation and/or Health Education, and Supportive Reflection Standardized Assessments completed: Not Needed  Patient and/or Family Response: The patient attended today's session and reported excitement about planning her  upcoming wedding with her fianc. She also discussed challenges with setting boundaries, particularly with her mother, and processed a recent family conflict over the holidays. The patient described feeling sidelined in a decision her mother made on her behalf, despite her efforts to express her feelings. Her mother, however, continued to assert what she believed was best for the patient and her fianc. The patient acknowledged that setting boundaries can be challenging, especially with opinionated and strong-willed individuals. She also expressed understanding that consistency in enforcing boundaries is key to improvement.  Patient Centered Plan: Patient is on the following Treatment Plan(s): Stress   Assessment: The patient demonstrates growing insight into her boundary-setting style and a willingness to develop strategies for asserting herself. She remains motivated to improve communication within her family and to balance the excitement of wedding planning with potential stressors.   Patient may benefit from  from continued support of this clinic to support healthy adjustment and to implement/gain positive coping mechanisms to reduce stress.  Plan: Follow up with behavioral health clinician on : 09/01/2023 Behavioral recommendations: Remember to practice consistency in setting and enforcing boundaries with family members, especially in situations where you input may be overlooked. Continue utilizing assertive communication techniques, such as calmly expressing your feelings, appropriately disagreeing when necessary and standing firm on your decisions. Continue to engage in self-care strategies to manage stress related to wedding planning and family interactions with your fiance.  Referral(s): Integrated Behavioral Health Services (In Clinic) From scale of 1-10, how likely are you to follow plan?: Patient agreeable to above plan.   Nevin Kozuch LITTIE Seats, LCSWA

## 2023-08-20 ENCOUNTER — Other Ambulatory Visit: Payer: Self-pay | Admitting: *Deleted

## 2023-08-20 ENCOUNTER — Ambulatory Visit: Payer: Medicaid Other | Admitting: *Deleted

## 2023-08-20 ENCOUNTER — Encounter: Payer: Self-pay | Admitting: *Deleted

## 2023-08-20 ENCOUNTER — Other Ambulatory Visit: Payer: Self-pay

## 2023-08-20 ENCOUNTER — Other Ambulatory Visit: Payer: Self-pay | Admitting: Family Medicine

## 2023-08-20 ENCOUNTER — Ambulatory Visit: Payer: Medicaid Other | Attending: Family Medicine

## 2023-08-20 VITALS — BP 135/63 | HR 78

## 2023-08-20 DIAGNOSIS — Z6833 Body mass index (BMI) 33.0-33.9, adult: Secondary | ICD-10-CM | POA: Insufficient documentation

## 2023-08-20 DIAGNOSIS — Z363 Encounter for antenatal screening for malformations: Secondary | ICD-10-CM | POA: Diagnosis not present

## 2023-08-20 DIAGNOSIS — O99212 Obesity complicating pregnancy, second trimester: Secondary | ICD-10-CM | POA: Insufficient documentation

## 2023-08-20 DIAGNOSIS — E669 Obesity, unspecified: Secondary | ICD-10-CM | POA: Diagnosis not present

## 2023-08-20 DIAGNOSIS — Z3401 Encounter for supervision of normal first pregnancy, first trimester: Secondary | ICD-10-CM | POA: Insufficient documentation

## 2023-08-20 DIAGNOSIS — O219 Vomiting of pregnancy, unspecified: Secondary | ICD-10-CM

## 2023-08-20 DIAGNOSIS — Z3402 Encounter for supervision of normal first pregnancy, second trimester: Secondary | ICD-10-CM | POA: Diagnosis not present

## 2023-08-20 DIAGNOSIS — Z3A19 19 weeks gestation of pregnancy: Secondary | ICD-10-CM | POA: Diagnosis not present

## 2023-08-20 DIAGNOSIS — Z362 Encounter for other antenatal screening follow-up: Secondary | ICD-10-CM

## 2023-08-20 DIAGNOSIS — Z3689 Encounter for other specified antenatal screening: Secondary | ICD-10-CM | POA: Insufficient documentation

## 2023-08-20 DIAGNOSIS — Z3400 Encounter for supervision of normal first pregnancy, unspecified trimester: Secondary | ICD-10-CM

## 2023-09-01 ENCOUNTER — Ambulatory Visit: Payer: Medicaid Other | Admitting: Licensed Clinical Social Worker

## 2023-09-08 ENCOUNTER — Ambulatory Visit (INDEPENDENT_AMBULATORY_CARE_PROVIDER_SITE_OTHER): Payer: Medicaid Other | Admitting: Advanced Practice Midwife

## 2023-09-08 VITALS — BP 119/71 | HR 85 | Wt 224.6 lb

## 2023-09-08 DIAGNOSIS — Z3A21 21 weeks gestation of pregnancy: Secondary | ICD-10-CM

## 2023-09-08 DIAGNOSIS — Z6833 Body mass index (BMI) 33.0-33.9, adult: Secondary | ICD-10-CM

## 2023-09-08 DIAGNOSIS — D649 Anemia, unspecified: Secondary | ICD-10-CM

## 2023-09-08 DIAGNOSIS — Z3401 Encounter for supervision of normal first pregnancy, first trimester: Secondary | ICD-10-CM

## 2023-09-08 DIAGNOSIS — O99012 Anemia complicating pregnancy, second trimester: Secondary | ICD-10-CM

## 2023-09-08 DIAGNOSIS — Z3400 Encounter for supervision of normal first pregnancy, unspecified trimester: Secondary | ICD-10-CM

## 2023-09-08 MED ORDER — ACCRUFER 30 MG PO CAPS: 1.0000 | ORAL_CAPSULE | Freq: Two times a day (BID) | ORAL | 5 refills | Status: AC

## 2023-09-08 MED ORDER — CITRANATAL B-CALM 20-1 MG & 2 X 25 MG PO MISC: 1.0000 | Freq: Every day | ORAL | 12 refills | Status: DC

## 2023-09-08 NOTE — Progress Notes (Incomplete)
   PRENATAL VISIT NOTE  Subjective:  Adriana Decker is a 23 y.o. G1P0 at [redacted]w[redacted]d being seen today for ongoing prenatal care.  She is currently monitored for the following issues for this {Blank single:19197::"high-risk","low-risk"} pregnancy and has Encounter for supervision of normal first pregnancy in first trimester on their problem list.  Patient reports {sx:14538}.  Contractions: Irregular. Vag. Bleeding: None.  Movement: Present. Denies leaking of fluid.   The following portions of the patient's history were reviewed and updated as appropriate: allergies, current medications, past family history, past medical history, past social history, past surgical history and problem list.   Objective:   Vitals:   09/08/23 1334  BP: 119/71  Pulse: 85  Weight: 224 lb 9.6 oz (101.9 kg)    Fetal Status:     Movement: Present     General:  Alert, oriented and cooperative. Patient is in no acute distress.  Skin: Skin is warm and dry. No rash noted.   Cardiovascular: Normal heart rate noted  Respiratory: Normal respiratory effort, no problems with respiration noted  Abdomen: Soft, gravid, appropriate for gestational age.  Pain/Pressure: Present     Pelvic: {Blank single:19197::"Cervical exam performed in the presence of a chaperone","Cervical exam deferred"}        Extremities: Normal range of motion.  Edema: None  Mental Status: Normal mood and affect. Normal behavior. Normal judgment and thought content.   Assessment and Plan:  Pregnancy: G1P0 at [redacted]w[redacted]d There are no diagnoses linked to this encounter. {Blank single:19197::"Term","Preterm"} labor symptoms and general obstetric precautions including but not limited to vaginal bleeding, contractions, leaking of fluid and fetal movement were reviewed in detail with the patient. Please refer to After Visit Summary for other counseling recommendations.   No follow-ups on file.  Future Appointments  Date Time Provider Department Center   09/24/2023  3:30 PM [facility] US3 [facility]  10/06/2023  4:10 PM Allie Bossier, [place] [facility] None    Colman Cater, NP

## 2023-09-08 NOTE — Progress Notes (Cosign Needed)
PRENATAL VISIT NOTE  Subjective:  Adriana Decker is a 23 y.o. G1P0 at [redacted]w[redacted]d being seen today for ongoing prenatal care.  She is currently monitored for the following issues for this low-risk pregnancy and has Encounter for supervision of normal first pregnancy in first trimester on their problem list.  Patient reports fatigue.  Contractions: Irregular. Vag. Bleeding: None.  Movement: Present. Denies leaking of fluid.   The following portions of the patient's history were reviewed and updated as appropriate: allergies, current medications, past family history, past medical history, past social history, past surgical history and problem list.   Objective:   Vitals:   09/08/23 1334  BP: 119/71  Pulse: 85  Weight: 101.9 kg    Fetal Status: Fetal Heart Rate (bpm): 153 Fundal Height: 22 cm Movement: Present     General:  Alert, oriented and cooperative. Patient is in no acute distress.  Skin: Skin is warm and dry. No rash noted.   Cardiovascular: Normal heart rate noted  Respiratory: Normal respiratory effort, no problems with respiration noted  Abdomen: Soft, gravid, appropriate for gestational age.  Pain/Pressure: Present     Pelvic: Cervical exam deferred        Extremities: Normal range of motion.  Edema: None  Mental Status: Normal mood and affect. Normal behavior. Normal judgment and thought content.   Assessment and Plan:  Pregnancy: G1P0 at [redacted]w[redacted]d 1. [redacted] weeks gestation of pregnancy (Primary)   2. BMI 33.0-33.9,adult   3. Primiparous, antepartum   4. Encounter for supervision of normal first pregnancy in second trimester --Anticipatory guidance about next visits/weeks of pregnancy given.   5. Anemia during pregnancy in second trimester -HGB 10.2 at new OB - Ferric Maltol (ACCRUFER) 30 MG CAPS; Take 1 capsule (30 mg total) by mouth 2 (two) times daily.  Dispense: 60 capsule; Refill: 5  6. Symptomatic anemia -Patient reports feeling tired despite getting enough  sleep.  - Ferric Maltol (ACCRUFER) 30 MG CAPS; Take 1 capsule (30 mg total) by mouth 2 (two) times daily.  Dispense: 60 capsule; Refill: 5  Preterm labor symptoms and general obstetric precautions including but not limited to vaginal bleeding, contractions, leaking of fluid and fetal movement were reviewed in detail with the patient. Please refer to After Visit Summary for other counseling recommendations.   No follow-ups on file.  Future Appointments  Date Time Provider Department Center  09/24/2023  3:30 PM [facility] US3 [facility]  10/06/2023  4:10 PM Allie Bossier, MD [facility] None    Zenia Resides, student-FNP CNM attestation:  I have seen and examined this patient; I agree with above documentation in the NP student's note.   Nichol Worthy is a 23 y.o. G1P0 in the [place] office for routine prenatal visit for low risk pregnancy. See problem list below. +FM, denies LOF, VB, contractions, vaginal discharge.  Patient Active Problem List   Diagnosis Date Noted   Encounter for supervision of normal first pregnancy in first trimester 06/18/2023     ROS, labs, PMH reviewed  PE: BP 119/71   Pulse 85   Wt 224 lb 9.6 oz (101.9 kg)   LMP 04/09/2023   BMI 37.38 kg/m  Gen: calm comfortable, well appearing Resp: normal effort, no distress Abd: gravid appropriate for gestational age  Fundal height: 69 FHT by doppler: 153  Plan: - fetal kick counts reinforced, preterm labor precautions -    1. [redacted] weeks gestation of pregnancy (Primary)   2. BMI 33.0-33.9,adult   3. Primiparous, antepartum  4. Anemia during pregnancy in second trimester  - Ferric Maltol (ACCRUFER) 30 MG CAPS; Take 1 capsule (30 mg total) by mouth 2 (two) times daily.  Dispense: 60 capsule; Refill: 5  5. Symptomatic anemia  - Ferric Maltol (ACCRUFER) 30 MG CAPS; Take 1 capsule (30 mg total) by mouth 2 (two) times daily.  Dispense: 60 capsule; Refill: 5    Sharen Counter, CNM 8:37 AM

## 2023-09-09 ENCOUNTER — Other Ambulatory Visit: Payer: Self-pay | Admitting: *Deleted

## 2023-09-09 MED ORDER — VITAFOL ULTRA 29-0.6-0.4-200 MG PO CAPS: 1.0000 | ORAL_CAPSULE | Freq: Every day | ORAL | 11 refills | Status: AC

## 2023-09-09 NOTE — Progress Notes (Signed)
Change made to PNV, previous order is no longer available.

## 2023-09-17 ENCOUNTER — Encounter: Payer: Self-pay | Admitting: *Deleted

## 2023-09-17 DIAGNOSIS — O99210 Obesity complicating pregnancy, unspecified trimester: Secondary | ICD-10-CM | POA: Insufficient documentation

## 2023-09-24 ENCOUNTER — Other Ambulatory Visit: Payer: Self-pay | Admitting: *Deleted

## 2023-09-24 ENCOUNTER — Other Ambulatory Visit: Payer: Self-pay

## 2023-09-24 ENCOUNTER — Ambulatory Visit: Payer: Medicaid Other | Attending: Maternal & Fetal Medicine

## 2023-09-24 DIAGNOSIS — O99212 Obesity complicating pregnancy, second trimester: Secondary | ICD-10-CM | POA: Diagnosis present

## 2023-09-24 DIAGNOSIS — E669 Obesity, unspecified: Secondary | ICD-10-CM

## 2023-09-24 DIAGNOSIS — Z362 Encounter for other antenatal screening follow-up: Secondary | ICD-10-CM | POA: Diagnosis present

## 2023-09-24 DIAGNOSIS — Z3A24 24 weeks gestation of pregnancy: Secondary | ICD-10-CM | POA: Diagnosis not present

## 2023-10-06 ENCOUNTER — Encounter: Payer: Medicaid Other | Admitting: Obstetrics & Gynecology

## 2023-10-07 ENCOUNTER — Ambulatory Visit (INDEPENDENT_AMBULATORY_CARE_PROVIDER_SITE_OTHER): Payer: Medicaid Other | Admitting: Obstetrics & Gynecology

## 2023-10-07 VITALS — BP 115/77 | HR 83 | Wt 229.0 lb

## 2023-10-07 DIAGNOSIS — O2600 Excessive weight gain in pregnancy, unspecified trimester: Secondary | ICD-10-CM | POA: Diagnosis not present

## 2023-10-07 DIAGNOSIS — Z3A26 26 weeks gestation of pregnancy: Secondary | ICD-10-CM

## 2023-10-07 DIAGNOSIS — O99210 Obesity complicating pregnancy, unspecified trimester: Secondary | ICD-10-CM

## 2023-10-07 DIAGNOSIS — O99019 Anemia complicating pregnancy, unspecified trimester: Secondary | ICD-10-CM | POA: Insufficient documentation

## 2023-10-07 NOTE — Progress Notes (Signed)
   PRENATAL VISIT NOTE  Subjective:  Adriana Decker is a 23 y.o. G1P0 at [redacted]w[redacted]d being seen today for ongoing prenatal care.  She is currently monitored for the following issues for this high-risk pregnancy and has Encounter for supervision of normal first pregnancy in first trimester; Pregnancy complicated by obesity; Anemia in pregnancy; and Excessive weight gain during pregnancy, antepartum on their problem list.  Patient reports no complaints.  Contractions: Not present. Vag. Bleeding: None.  Movement: Present. Denies leaking of fluid.   The following portions of the patient's history were reviewed and updated as appropriate: allergies, current medications, past family history, past medical history, past social history, past surgical history and problem list.   Objective:   Vitals:   10/07/23 1341  BP: 115/77  Pulse: 83  Weight: 229 lb (103.9 kg)    Fetal Status: Fetal Heart Rate (bpm): 140 Fundal Height: 27 cm Movement: Present     General:  Alert, oriented and cooperative. Patient is in no acute distress.  Skin: Skin is warm and dry. No rash noted.   Cardiovascular: Normal heart rate noted  Respiratory: Normal respiratory effort, no problems with respiration noted  Abdomen: Soft, gravid, appropriate for gestational age.  Pain/Pressure: Present     Pelvic: Cervical exam deferred        Extremities: Normal range of motion.  Edema: None  Mental Status: Normal mood and affect. Normal behavior. Normal judgment and thought content.   Assessment and Plan:  Pregnancy: G1P0 at [redacted]w[redacted]d 1. Antepartum anemia (Primary)  - CBC - she is taking iron, having some constipation. I rec'd OTC meds for relief as well as prunes, fiber  2. Obesity affecting pregnancy, antepartum, unspecified obesity type  - TSH + free T4 - Protein / creatinine ratio, urine - Comp Met (CMET) - HgB A1c  3. [redacted] weeks gestation of pregnancy   4. Excessive weight gain during pregnancy, antepartum - We discussed  risks including stillbirth. - Rec healthy eating (and drinking) habits and routine exercise  Preterm labor symptoms and general obstetric precautions including but not limited to vaginal bleeding, contractions, leaking of fluid and fetal movement were reviewed in detail with the patient. Please refer to After Visit Summary for other counseling recommendations.   Return today (on 10/07/2023).  Future Appointments  Date Time Provider Department Center  11/20/2023  3:30 PM [facility] US1 [facility]    Allie Bossier, MD

## 2023-10-08 LAB — COMPREHENSIVE METABOLIC PANEL
ALT: 7 [IU]/L (ref 0–32)
AST: 18 [IU]/L (ref 0–40)
Albumin: 3.5 g/dL — ABNORMAL LOW (ref 4.0–5.0)
Alkaline Phosphatase: 86 [IU]/L (ref 44–121)
BUN/Creatinine Ratio: 8 — ABNORMAL LOW (ref 9–23)
BUN: 5 mg/dL — ABNORMAL LOW (ref 6–20)
Bilirubin Total: 0.2 mg/dL (ref 0.0–1.2)
CO2: 21 mmol/L (ref 20–29)
Calcium: 8.8 mg/dL (ref 8.7–10.2)
Chloride: 101 mmol/L (ref 96–106)
Creatinine, Ser: 0.59 mg/dL (ref 0.57–1.00)
Globulin, Total: 3.4 g/dL (ref 1.5–4.5)
Glucose: 88 mg/dL (ref 70–99)
Potassium: 3.9 mmol/L (ref 3.5–5.2)
Sodium: 136 mmol/L (ref 134–144)
Total Protein: 6.9 g/dL (ref 6.0–8.5)
eGFR: 131 mL/min/{1.73_m2} (ref >59)

## 2023-10-08 LAB — HEMOGLOBIN A1C
Est. average glucose Bld gHb Est-mCnc: 111 mg/dL
Hgb A1c MFr Bld: 5.5 % (ref 4.8–5.6)

## 2023-10-08 LAB — CBC
Hematocrit: 34.5 % (ref 34.0–46.6)
Hemoglobin: 10.9 g/dL — ABNORMAL LOW (ref 11.1–15.9)
MCH: 23.7 pg — ABNORMAL LOW (ref 26.6–33.0)
MCHC: 31.6 g/dL (ref 31.5–35.7)
MCV: 75 fL — ABNORMAL LOW (ref 79–97)
Platelets: 375 10*3/uL (ref 150–450)
RBC: 4.59 x10E6/uL (ref 3.77–5.28)
RDW: 16.4 % — ABNORMAL HIGH (ref 11.7–15.4)
WBC: 13.1 10*3/uL — ABNORMAL HIGH (ref 3.4–10.8)

## 2023-10-08 LAB — PROTEIN / CREATININE RATIO, URINE
Creatinine, Urine: 119.1 mg/dL
Protein, Ur: 14.7 mg/dL
Protein/Creat Ratio: 123 mg/g{creat} (ref 0–200)

## 2023-10-08 LAB — TSH+FREE T4
Free T4: 0.85 ng/dL (ref 0.82–1.77)
TSH: 1.83 u[IU]/mL (ref 0.450–4.500)

## 2023-10-28 ENCOUNTER — Other Ambulatory Visit: Payer: Medicaid Other

## 2023-10-28 ENCOUNTER — Encounter: Payer: Medicaid Other | Admitting: Obstetrics and Gynecology

## 2023-10-28 DIAGNOSIS — Z3401 Encounter for supervision of normal first pregnancy, first trimester: Secondary | ICD-10-CM

## 2023-10-28 DIAGNOSIS — Z3A28 28 weeks gestation of pregnancy: Secondary | ICD-10-CM

## 2023-10-30 ENCOUNTER — Encounter: Admitting: Physician Assistant

## 2023-11-04 ENCOUNTER — Other Ambulatory Visit

## 2023-11-04 ENCOUNTER — Ambulatory Visit: Admitting: Obstetrics & Gynecology

## 2023-11-04 VITALS — BP 107/71 | HR 73 | Wt 240.0 lb

## 2023-11-04 DIAGNOSIS — Z1339 Encounter for screening examination for other mental health and behavioral disorders: Secondary | ICD-10-CM

## 2023-11-04 DIAGNOSIS — Z3401 Encounter for supervision of normal first pregnancy, first trimester: Secondary | ICD-10-CM

## 2023-11-04 DIAGNOSIS — Z3A29 29 weeks gestation of pregnancy: Secondary | ICD-10-CM

## 2023-11-04 DIAGNOSIS — Z23 Encounter for immunization: Secondary | ICD-10-CM | POA: Diagnosis not present

## 2023-11-04 NOTE — Progress Notes (Signed)
   PRENATAL VISIT NOTE  Subjective:  Adriana Decker is a 23 y.o. G1P0 at [redacted]w[redacted]d being seen today for ongoing prenatal care.  She is currently monitored for the following issues for this low-risk pregnancy and has Encounter for supervision of normal first pregnancy in first trimester; Pregnancy complicated by obesity; Anemia in pregnancy; and Excessive weight gain during pregnancy, antepartum on their problem list.  Patient reports no complaints.  Contractions: Irritability. Vag. Bleeding: None.  Movement: Present. Denies leaking of fluid.   The following portions of the patient's history were reviewed and updated as appropriate: allergies, current medications, past family history, past medical history, past social history, past surgical history and problem list.   Objective:   Vitals:   11/04/23 0931  BP: 107/71  Pulse: 73  Weight: 240 lb (108.9 kg)    Fetal Status: Fetal Heart Rate (bpm): 150 Fundal Height: 30 cm Movement: Present     General:  Alert, oriented and cooperative. Patient is in no acute distress.  Skin: Skin is warm and dry. No rash noted.   Cardiovascular: Normal heart rate noted  Respiratory: Normal respiratory effort, no problems with respiration noted  Abdomen: Soft, gravid, appropriate for gestational age.  Pain/Pressure: Present     Pelvic: Cervical exam deferred        Extremities: Normal range of motion.  Edema: Trace  Mental Status: Normal mood and affect. Normal behavior. Normal judgment and thought content.   Assessment and Plan:  Pregnancy: G1P0 at [redacted]w[redacted]d 1. Encounter for supervision of normal first pregnancy in first trimester (Primary)  - Glucose Tolerance, 2 Hours w/1 Hour - RPR - CBC - HIV antibody (with reflex) - Tdap vaccine greater than or equal to 7yo IM  2. [redacted] weeks gestation of pregnancy  - Glucose Tolerance, 2 Hours w/1 Hour - RPR - CBC - HIV antibody (with reflex) - Tdap vaccine greater than or equal to 7yo IM  Preterm labor  symptoms and general obstetric precautions including but not limited to vaginal bleeding, contractions, leaking of fluid and fetal movement were reviewed in detail with the patient. Please refer to After Visit Summary for other counseling recommendations.   Return in about 3 weeks (around 11/25/2023).  Future Appointments  Date Time Provider Department Center  11/20/2023  3:30 PM [facility] US[facility]    Scheryl Darter, MD

## 2023-11-04 NOTE — Progress Notes (Signed)
 ROB/GTT.  TDAp vaccine given in LD, tolerated well.

## 2023-11-05 ENCOUNTER — Encounter: Admitting: Physician Assistant

## 2023-11-05 LAB — CBC
Hematocrit: 33.8 % — ABNORMAL LOW (ref 34.0–46.6)
Hemoglobin: 10.7 g/dL — ABNORMAL LOW (ref 11.1–15.9)
MCH: 23.4 pg — ABNORMAL LOW (ref 26.6–33.0)
MCHC: 31.7 g/dL (ref 31.5–35.7)
MCV: 74 fL — ABNORMAL LOW (ref 79–97)
Platelets: 365 10*3/uL (ref 150–450)
RBC: 4.57 x10E6/uL (ref 3.77–5.28)
RDW: 16 % — ABNORMAL HIGH (ref 11.7–15.4)
WBC: 11.9 10*3/uL — ABNORMAL HIGH (ref 3.4–10.8)

## 2023-11-05 LAB — GLUCOSE TOLERANCE, 2 HOURS W/ 1HR
Glucose, 1 hour: 156 mg/dL (ref 70–179)
Glucose, 2 hour: 140 mg/dL (ref 70–152)
Glucose, Fasting: 77 mg/dL (ref 70–91)

## 2023-11-05 LAB — HIV ANTIBODY (ROUTINE TESTING W REFLEX): HIV Screen 4th Generation wRfx: NONREACTIVE

## 2023-11-05 LAB — RPR: RPR Ser Ql: NONREACTIVE

## 2023-11-06 ENCOUNTER — Encounter: Admitting: Obstetrics and Gynecology

## 2023-11-20 ENCOUNTER — Ambulatory Visit: Payer: Medicaid Other | Attending: Obstetrics and Gynecology

## 2023-11-20 VITALS — BP 106/49

## 2023-11-20 DIAGNOSIS — Z3689 Encounter for other specified antenatal screening: Secondary | ICD-10-CM | POA: Insufficient documentation

## 2023-11-20 DIAGNOSIS — O99213 Obesity complicating pregnancy, third trimester: Secondary | ICD-10-CM

## 2023-11-20 DIAGNOSIS — Z3A36 36 weeks gestation of pregnancy: Secondary | ICD-10-CM

## 2023-11-20 DIAGNOSIS — Z3A32 32 weeks gestation of pregnancy: Secondary | ICD-10-CM | POA: Insufficient documentation

## 2023-11-20 DIAGNOSIS — E669 Obesity, unspecified: Secondary | ICD-10-CM

## 2023-11-20 DIAGNOSIS — O99212 Obesity complicating pregnancy, second trimester: Secondary | ICD-10-CM | POA: Insufficient documentation

## 2023-11-20 DIAGNOSIS — Z362 Encounter for other antenatal screening follow-up: Secondary | ICD-10-CM | POA: Diagnosis present

## 2023-11-20 DIAGNOSIS — Z3401 Encounter for supervision of normal first pregnancy, first trimester: Secondary | ICD-10-CM | POA: Insufficient documentation

## 2023-11-25 ENCOUNTER — Ambulatory Visit: Admitting: Obstetrics and Gynecology

## 2023-11-25 VITALS — BP 108/73 | HR 83 | Wt 249.0 lb

## 2023-11-25 DIAGNOSIS — Z3A32 32 weeks gestation of pregnancy: Secondary | ICD-10-CM | POA: Diagnosis not present

## 2023-11-25 DIAGNOSIS — Z3401 Encounter for supervision of normal first pregnancy, first trimester: Secondary | ICD-10-CM | POA: Diagnosis not present

## 2023-11-25 DIAGNOSIS — K5909 Other constipation: Secondary | ICD-10-CM

## 2023-11-25 DIAGNOSIS — Z6833 Body mass index (BMI) 33.0-33.9, adult: Secondary | ICD-10-CM

## 2023-11-25 DIAGNOSIS — O99013 Anemia complicating pregnancy, third trimester: Secondary | ICD-10-CM | POA: Diagnosis not present

## 2023-11-25 NOTE — Patient Instructions (Signed)
Helpful things for constipation  - Kiwi fruit with skin on (only 1 per day!) - Increase fiber in diet and drink lots of water - Over the counter laxatives - Miralax or senna daily. Senna can cause more cramping, so Miralax might be most helpful. Drink with lots of water or you can get dehydrated

## 2023-11-25 NOTE — Progress Notes (Signed)
   PRENATAL VISIT NOTE  Subjective:  Adriana Decker is a 23 y.o. G1P0 at [redacted]w[redacted]d being seen today for ongoing prenatal care.  She is currently monitored for the following issues for this low-risk pregnancy and has Encounter for supervision of normal first pregnancy in first trimester; Pregnancy complicated by obesity; Anemia in pregnancy; and Excessive weight gain during pregnancy, antepartum on their problem list.  Patient reports  tired but doing well and able to do usual activities .  Contractions: Not present. Vag. Bleeding: None.  Movement: Present. Denies leaking of fluid.   The following portions of the patient's history were reviewed and updated as appropriate: allergies, current medications, past family history, past medical history, past social history, past surgical history and problem list.   Objective:   Vitals:   11/25/23 1400  BP: 108/73  Pulse: 83  Weight: 249 lb (112.9 kg)    Fetal Status: Fetal Heart Rate (bpm): 145   Movement: Present     General:  Alert, oriented and cooperative. Patient is in no acute distress.  Skin: Skin is warm and dry. No rash noted.   Cardiovascular: Normal heart rate noted  Respiratory: Normal respiratory effort, no problems with respiration noted  Abdomen: Soft, gravid, appropriate for gestational age.  Pain/Pressure: Present      Assessment and Plan:  Pregnancy: G1P0 at [redacted]w[redacted]d 1. Encounter for supervision of normal first pregnancy in first trimester (Primary) 2. [redacted] weeks gestation of pregnancy Discussed si/sx PTL, where to present for emergencies Pediatrician list given  3. Anemia during pregnancy in third trimester 4. Other constipation /3\ Hgb 10.7 Taking po iron 1-2 times per week due to constipation Discussed increasing to 3-4 times per week for iron and strategies to manage constipation (see AVS)  5. BMI 33.0-33.9,adult Growth US  4/10 @ 32/1 2109g (69%), AC 86%, transverse, posterior, 12.92 No further growths  scheduled ldASA  Preterm labor symptoms and general obstetric precautions including but not limited to vaginal bleeding, contractions, leaking of fluid and fetal movement were reviewed in detail with the patient. Please refer to After Visit Summary for other counseling recommendations.   Return in about 2 weeks (around 12/09/2023) for return OB at 34-35 weeks.  No future appointments.  Izell Marsh, MD

## 2023-12-09 ENCOUNTER — Encounter: Payer: Self-pay | Admitting: Obstetrics & Gynecology

## 2023-12-09 ENCOUNTER — Ambulatory Visit: Admitting: Obstetrics & Gynecology

## 2023-12-09 VITALS — BP 111/76 | HR 67 | Wt 260.0 lb

## 2023-12-09 DIAGNOSIS — Z3401 Encounter for supervision of normal first pregnancy, first trimester: Secondary | ICD-10-CM

## 2023-12-09 DIAGNOSIS — Z3A34 34 weeks gestation of pregnancy: Secondary | ICD-10-CM | POA: Diagnosis not present

## 2023-12-09 NOTE — Progress Notes (Signed)
   PRENATAL VISIT NOTE  Subjective:  Adriana Decker is a 23 y.o. G1P0 at [redacted]w[redacted]d being seen today for ongoing prenatal care.  She is currently monitored for the following issues for this low-risk pregnancy and has Encounter for supervision of normal first pregnancy in first trimester; Pregnancy complicated by obesity; Anemia in pregnancy; and Excessive weight gain during pregnancy, antepartum on their problem list.  Patient reports  swelling .  Contractions: Not present. Vag. Bleeding: None.  Movement: Present. Denies leaking of fluid.   The following portions of the patient's history were reviewed and updated as appropriate: allergies, current medications, past family history, past medical history, past social history, past surgical history and problem list.   Objective:   Vitals:   12/09/23 1442  BP: 111/76  Pulse: 67  Weight: 260 lb (117.9 kg)    Fetal Status:     Movement: Present     General:  Alert, oriented and cooperative. Patient is in no acute distress.  Skin: Skin is warm and dry. No rash noted.   Cardiovascular: Normal heart rate noted  Respiratory: Normal respiratory effort, no problems with respiration noted  Abdomen: Soft, gravid, appropriate for gestational age.  Pain/Pressure: Present     Pelvic: Cervical exam deferred        Extremities: Normal range of motion.  Edema: Trace  Mental Status: Normal mood and affect. Normal behavior. Normal judgment and thought content.   Assessment and Plan:  Pregnancy: G1P0 at [redacted]w[redacted]d 1. Encounter for supervision of normal first pregnancy in first trimester (Primary) Doing well nl BP  2. [redacted] weeks gestation of pregnancy [redacted]w[redacted]d   Preterm labor symptoms and general obstetric precautions including but not limited to vaginal bleeding, contractions, leaking of fluid and fetal movement were reviewed in detail with the patient. Please refer to After Visit Summary for other counseling recommendations.   Return in about 1 week (around  12/16/2023).  Future Appointments  Date Time Provider Department Center  12/16/2023  3:50 PM Tresia Fruit, MD [facility] None    Onnie Bilis, MD

## 2023-12-09 NOTE — Progress Notes (Signed)
 Pt would like to discuss swelling in LE and face. Pt has occasional HA's.

## 2023-12-16 ENCOUNTER — Ambulatory Visit (INDEPENDENT_AMBULATORY_CARE_PROVIDER_SITE_OTHER): Admitting: Obstetrics & Gynecology

## 2023-12-16 VITALS — BP 132/86 | HR 81 | Wt 259.3 lb

## 2023-12-16 DIAGNOSIS — O99013 Anemia complicating pregnancy, third trimester: Secondary | ICD-10-CM | POA: Diagnosis not present

## 2023-12-16 DIAGNOSIS — Z3401 Encounter for supervision of normal first pregnancy, first trimester: Secondary | ICD-10-CM | POA: Diagnosis not present

## 2023-12-16 NOTE — Progress Notes (Signed)
 Pt presents for ROB. Pt requests female provider for swab.

## 2023-12-16 NOTE — Progress Notes (Signed)
   PRENATAL VISIT NOTE  Subjective:  Adriana Decker is a 23 y.o. G1P0 at [redacted]w[redacted]d being seen today for ongoing prenatal care.  She is currently monitored for the following issues for this low-risk pregnancy and has Encounter for supervision of normal first pregnancy in first trimester; Pregnancy complicated by obesity; Anemia in pregnancy; and Excessive weight gain during pregnancy, antepartum on their problem list.  Patient reports occasional contractions.  Contractions: Irregular (every few hours; last for a couple of minutes). Vag. Bleeding: None.  Movement: Present. Denies leaking of fluid.   The following portions of the patient's history were reviewed and updated as appropriate: allergies, current medications, past family history, past medical history, past social history, past surgical history and problem list.   Objective:   Vitals:   12/16/23 1601  BP: 132/86  Pulse: 81  Weight: 259 lb 4.8 oz (117.6 kg)    Fetal Status: Fetal Heart Rate (bpm): 151 Fundal Height: 36 cm Movement: Present     General:  Alert, oriented and cooperative. Patient is in no acute distress.  Skin: Skin is warm and dry. No rash noted.   Cardiovascular: Normal heart rate noted  Respiratory: Normal respiratory effort, no problems with respiration noted  Abdomen: Soft, gravid, appropriate for gestational age.  Pain/Pressure: Present     Pelvic: Cervical exam deferred        Extremities: Normal range of motion.  Edema: Trace  Mental Status: Normal mood and affect. Normal behavior. Normal judgment and thought content.   Assessment and Plan:  Pregnancy: G1P0 at [redacted]w[redacted]d 1. Encounter for supervision of normal first pregnancy in first trimester (Primary)   2. Anemia during pregnancy in third trimester   Preterm labor symptoms and general obstetric precautions including but not limited to vaginal bleeding, contractions, leaking of fluid and fetal movement were reviewed in detail with the patient. Please refer  to After Visit Summary for other counseling recommendations.   Return in about 1 week (around 12/23/2023).  No future appointments.  Onnie Bilis, MD

## 2023-12-23 ENCOUNTER — Ambulatory Visit (INDEPENDENT_AMBULATORY_CARE_PROVIDER_SITE_OTHER): Admitting: Advanced Practice Midwife

## 2023-12-23 ENCOUNTER — Encounter: Payer: Self-pay | Admitting: Advanced Practice Midwife

## 2023-12-23 VITALS — BP 133/87 | HR 69 | Wt 270.0 lb

## 2023-12-23 DIAGNOSIS — Z3A36 36 weeks gestation of pregnancy: Secondary | ICD-10-CM

## 2023-12-23 DIAGNOSIS — Z3401 Encounter for supervision of normal first pregnancy, first trimester: Secondary | ICD-10-CM | POA: Diagnosis not present

## 2023-12-23 NOTE — Progress Notes (Signed)
   PRENATAL VISIT NOTE  Subjective:  Adriana Decker is a 23 y.o. G1P0 at [redacted]w[redacted]d being seen today for ongoing prenatal care.  She is currently monitored for the following issues for this low-risk pregnancy and has Encounter for supervision of normal first pregnancy in first trimester; Pregnancy complicated by obesity; Anemia in pregnancy; and Excessive weight gain during pregnancy, antepartum on their problem list.  Patient reports occasional contractions.  Contractions: Irritability. Vag. Bleeding: None.  Movement: Present. Denies leaking of fluid.   The following portions of the patient's history were reviewed and updated as appropriate: allergies, current medications, past family history, past medical history, past social history, past surgical history and problem list.   Objective:   Vitals:   12/23/23 1455  BP: 133/87  Pulse: 69  Weight: 270 lb (122.5 kg)     Fetal Status: Fetal Heart Rate (bpm): 151 Fundal Height: 36 cm Movement: Present      General: Alert, oriented and cooperative. Patient is in no acute distress.  Skin: Skin is warm and dry. No rash noted.   Cardiovascular: Normal heart rate noted  Respiratory: Normal respiratory effort, no problems with respiration noted  Abdomen: Soft, gravid, appropriate for gestational age.  Pain/Pressure: Present     Pelvic: Cervical exam deferred        Extremities: Normal range of motion.  Edema: Mild pitting, slight indentation  Mental Status: Normal mood and affect. Normal behavior. Normal judgment and thought content.   Assessment and Plan:  Pregnancy: G1P0 at [redacted]w[redacted]d 1. Encounter for supervision of normal first pregnancy in first trimester --Anticipatory guidance about next visits/weeks of pregnancy given.   2. [redacted] weeks gestation of pregnancy (Primary)  - Culture, beta strep (group b only) - Cervicovaginal ancillary only( )  Preterm labor symptoms and general obstetric precautions including but not limited to  vaginal bleeding, contractions, leaking of fluid and fetal movement were reviewed in detail with the patient. Please refer to After Visit Summary for other counseling recommendations.   Return in about 1 week (around 12/30/2023) for LOB.  Future Appointments  Date Time Provider Department Center  12/31/2023  2:50 PM Gabrielle Joiner, MD [facility] None    Arlester Bence, CNM

## 2023-12-23 NOTE — Progress Notes (Signed)
 Pt presents for ROB visit. No concerns

## 2023-12-27 LAB — CULTURE, BETA STREP (GROUP B ONLY): Strep Gp B Culture: NEGATIVE

## 2023-12-29 ENCOUNTER — Encounter: Payer: Self-pay | Admitting: Obstetrics and Gynecology

## 2023-12-31 ENCOUNTER — Ambulatory Visit: Admitting: Obstetrics

## 2023-12-31 ENCOUNTER — Ambulatory Visit (INDEPENDENT_AMBULATORY_CARE_PROVIDER_SITE_OTHER): Payer: Self-pay | Admitting: Pediatrics

## 2023-12-31 VITALS — BP 147/103 | HR 73 | Wt 280.0 lb

## 2023-12-31 DIAGNOSIS — Z3400 Encounter for supervision of normal first pregnancy, unspecified trimester: Secondary | ICD-10-CM

## 2023-12-31 DIAGNOSIS — O99210 Obesity complicating pregnancy, unspecified trimester: Secondary | ICD-10-CM | POA: Diagnosis not present

## 2023-12-31 DIAGNOSIS — R030 Elevated blood-pressure reading, without diagnosis of hypertension: Secondary | ICD-10-CM | POA: Diagnosis not present

## 2023-12-31 DIAGNOSIS — Z7681 Expectant parent(s) prebirth pediatrician visit: Secondary | ICD-10-CM

## 2023-12-31 DIAGNOSIS — O99013 Anemia complicating pregnancy, third trimester: Secondary | ICD-10-CM | POA: Diagnosis not present

## 2023-12-31 NOTE — Progress Notes (Signed)
 Pt presents for rob. Pt has no questions or concerns at this time. Pt would like her cervix checked

## 2023-12-31 NOTE — Progress Notes (Signed)
 Prenatal counseling for impending newborn done Parent agrees to vaccine and office policies [phone]

## 2024-01-01 ENCOUNTER — Encounter: Payer: Self-pay | Admitting: Obstetrics

## 2024-01-01 ENCOUNTER — Other Ambulatory Visit: Payer: Self-pay

## 2024-01-01 ENCOUNTER — Inpatient Hospital Stay (HOSPITAL_COMMUNITY)
Admission: AD | Admit: 2024-01-01 | Discharge: 2024-01-08 | DRG: 787 | Disposition: A | Discharge status: Home / Self Care | Attending: Obstetrics and Gynecology | Admitting: Obstetrics and Gynecology

## 2024-01-01 ENCOUNTER — Telehealth: Payer: Self-pay | Admitting: Obstetrics

## 2024-01-01 ENCOUNTER — Encounter (HOSPITAL_COMMUNITY): Payer: Self-pay | Admitting: Family Medicine

## 2024-01-01 ENCOUNTER — Ambulatory Visit

## 2024-01-01 VITALS — BP 141/102 | HR 85 | Wt 282.9 lb

## 2024-01-01 DIAGNOSIS — H9192 Unspecified hearing loss, left ear: Secondary | ICD-10-CM | POA: Diagnosis present

## 2024-01-01 DIAGNOSIS — Z3403 Encounter for supervision of normal first pregnancy, third trimester: Secondary | ICD-10-CM

## 2024-01-01 DIAGNOSIS — O9081 Anemia of the puerperium: Secondary | ICD-10-CM | POA: Diagnosis not present

## 2024-01-01 DIAGNOSIS — E66813 Obesity, class 3: Secondary | ICD-10-CM | POA: Diagnosis present

## 2024-01-01 DIAGNOSIS — O26613 Liver and biliary tract disorders in pregnancy, third trimester: Secondary | ICD-10-CM | POA: Diagnosis not present

## 2024-01-01 DIAGNOSIS — O99214 Obesity complicating childbirth: Secondary | ICD-10-CM | POA: Diagnosis present

## 2024-01-01 DIAGNOSIS — Z98891 History of uterine scar from previous surgery: Principal | ICD-10-CM

## 2024-01-01 DIAGNOSIS — D62 Acute posthemorrhagic anemia: Secondary | ICD-10-CM | POA: Diagnosis not present

## 2024-01-01 DIAGNOSIS — Z3A38 38 weeks gestation of pregnancy: Secondary | ICD-10-CM | POA: Diagnosis not present

## 2024-01-01 DIAGNOSIS — O134 Gestational [pregnancy-induced] hypertension without significant proteinuria, complicating childbirth: Secondary | ICD-10-CM | POA: Diagnosis present

## 2024-01-01 DIAGNOSIS — Z8249 Family history of ischemic heart disease and other diseases of the circulatory system: Secondary | ICD-10-CM | POA: Diagnosis not present

## 2024-01-01 DIAGNOSIS — R7401 Elevation of levels of liver transaminase levels: Secondary | ICD-10-CM | POA: Diagnosis not present

## 2024-01-01 DIAGNOSIS — O1414 Severe pre-eclampsia complicating childbirth: Principal | ICD-10-CM | POA: Diagnosis present

## 2024-01-01 DIAGNOSIS — O133 Gestational [pregnancy-induced] hypertension without significant proteinuria, third trimester: Secondary | ICD-10-CM

## 2024-01-01 LAB — CBC
HCT: 32.1 % — ABNORMAL LOW (ref 36.0–46.0)
Hemoglobin: 9.7 g/dL — ABNORMAL LOW (ref 12.0–15.0)
MCH: 21.4 pg — ABNORMAL LOW (ref 26.0–34.0)
MCHC: 30.2 g/dL (ref 30.0–36.0)
MCV: 70.9 fL — ABNORMAL LOW (ref 80.0–100.0)
Platelets: 287 10*3/uL (ref 150–400)
RBC: 4.53 MIL/uL (ref 3.87–5.11)
RDW: 19.2 % — ABNORMAL HIGH (ref 11.5–15.5)
WBC: 11 10*3/uL — ABNORMAL HIGH (ref 4.0–10.5)
nRBC: 0.6 % — ABNORMAL HIGH (ref 0.0–0.2)

## 2024-01-01 LAB — COMPREHENSIVE METABOLIC PANEL WITH GFR
ALT: 19 U/L (ref 0–44)
AST: 42 U/L — ABNORMAL HIGH (ref 15–41)
Albumin: 1.9 g/dL — ABNORMAL LOW (ref 3.5–5.0)
Alkaline Phosphatase: 112 U/L (ref 38–126)
Anion gap: 10 (ref 5–15)
BUN: 5 mg/dL — ABNORMAL LOW (ref 6–20)
CO2: 21 mmol/L — ABNORMAL LOW (ref 22–32)
Calcium: 7.9 mg/dL — ABNORMAL LOW (ref 8.9–10.3)
Chloride: 106 mmol/L (ref 98–111)
Creatinine, Ser: 0.68 mg/dL (ref 0.44–1.00)
GFR, Estimated: >60 mL/min (ref >60)
Glucose, Bld: 93 mg/dL (ref 70–99)
Potassium: 3.3 mmol/L — ABNORMAL LOW (ref 3.5–5.1)
Sodium: 137 mmol/L (ref 135–145)
Total Bilirubin: 0.3 mg/dL (ref 0.0–1.2)
Total Protein: 5.7 g/dL — ABNORMAL LOW (ref 6.5–8.1)

## 2024-01-01 LAB — PROTEIN / CREATININE RATIO, URINE
Creatinine, Urine: 24 mg/dL
Protein Creatinine Ratio: 8.75 mg/mg{creat} — ABNORMAL HIGH (ref 0.00–0.15)
Total Protein, Urine: 210 mg/dL

## 2024-01-01 MED ORDER — ONDANSETRON HCL 4 MG/2ML IJ SOLN
4.0000 mg | Freq: Four times a day (QID) | INTRAMUSCULAR | Status: DC | PRN
  Administered 2024-01-02: 4 mg via INTRAVENOUS
  Filled 2024-01-01: qty 2

## 2024-01-01 MED ORDER — LABETALOL HCL 5 MG/ML IV SOLN: 20.0000 mg | INTRAVENOUS | Status: DC | PRN

## 2024-01-01 MED ORDER — SOD CITRATE-CITRIC ACID 500-334 MG/5ML PO SOLN
30.0000 mL | ORAL | Status: DC | PRN
  Administered 2024-01-03: 30 mL via ORAL
  Filled 2024-01-01: qty 30

## 2024-01-01 MED ORDER — OXYTOCIN BOLUS FROM INFUSION: 333.0000 mL | Freq: Once | INTRAVENOUS | Status: DC

## 2024-01-01 MED ORDER — ACETAMINOPHEN 325 MG PO TABS: 650.0000 mg | ORAL_TABLET | ORAL | Status: DC | PRN

## 2024-01-01 MED ORDER — LIDOCAINE HCL (PF) 1 % IJ SOLN: 30.0000 mL | INTRAMUSCULAR | Status: DC | PRN

## 2024-01-01 MED ORDER — LABETALOL HCL 5 MG/ML IV SOLN: 40.0000 mg | INTRAVENOUS | Status: DC | PRN

## 2024-01-01 MED ORDER — FENTANYL CITRATE (PF) 100 MCG/2ML IJ SOLN: 50.0000 ug | INTRAMUSCULAR | Status: DC | PRN

## 2024-01-01 MED ORDER — TERBUTALINE SULFATE 1 MG/ML IJ SOLN
0.2500 mg | Freq: Once | INTRAMUSCULAR | Status: AC | PRN
  Administered 2024-01-03: 0.25 mg via SUBCUTANEOUS
  Filled 2024-01-01: qty 1

## 2024-01-01 MED ORDER — LABETALOL HCL 5 MG/ML IV SOLN: 80.0000 mg | INTRAVENOUS | Status: DC | PRN

## 2024-01-01 MED ORDER — HYDRALAZINE HCL 20 MG/ML IJ SOLN: 10.0000 mg | INTRAMUSCULAR | Status: DC | PRN

## 2024-01-01 MED ORDER — MISOPROSTOL 25 MCG QUARTER TABLET
25.0000 ug | ORAL_TABLET | Freq: Once | ORAL | Status: AC
  Administered 2024-01-01: 25 ug via VAGINAL
  Filled 2024-01-01: qty 1

## 2024-01-01 MED ORDER — LABETALOL HCL 5 MG/ML IV SOLN
40.0000 mg | INTRAVENOUS | Status: DC | PRN
  Administered 2024-01-01: 40 mg via INTRAVENOUS
  Filled 2024-01-01: qty 8

## 2024-01-01 MED ORDER — POTASSIUM CHLORIDE CRYS ER 20 MEQ PO TBCR
20.0000 meq | EXTENDED_RELEASE_TABLET | Freq: Two times a day (BID) | ORAL | Status: DC
  Administered 2024-01-02 – 2024-01-08 (×13): 20 meq via ORAL
  Filled 2024-01-01 (×14): qty 1

## 2024-01-01 MED ORDER — MAGNESIUM SULFATE 40 GM/1000ML IV SOLN
2.0000 g/h | INTRAVENOUS | Status: DC
  Administered 2024-01-01 – 2024-01-02 (×2): 2 g/h via INTRAVENOUS
  Filled 2024-01-01 (×2): qty 1000

## 2024-01-01 MED ORDER — LABETALOL HCL 200 MG PO TABS
200.0000 mg | ORAL_TABLET | Freq: Two times a day (BID) | ORAL | Status: DC
  Administered 2024-01-01 – 2024-01-02 (×2): 200 mg via ORAL
  Filled 2024-01-01 (×2): qty 1

## 2024-01-01 MED ORDER — LACTATED RINGERS IV SOLN: INTRAVENOUS | Status: AC

## 2024-01-01 MED ORDER — OXYTOCIN-SODIUM CHLORIDE 30-0.9 UT/500ML-% IV SOLN: 2.5000 [IU]/h | INTRAVENOUS | Status: DC

## 2024-01-01 MED ORDER — LABETALOL HCL 5 MG/ML IV SOLN
20.0000 mg | INTRAVENOUS | Status: DC | PRN
  Administered 2024-01-01: 20 mg via INTRAVENOUS
  Filled 2024-01-01 (×2): qty 4

## 2024-01-01 MED ORDER — LABETALOL HCL 5 MG/ML IV SOLN
INTRAVENOUS | Status: AC
Start: 2024-01-01 — End: 2024-01-01
  Administered 2024-01-01: 20 mg via INTRAVENOUS
  Filled 2024-01-01: qty 4

## 2024-01-01 MED ORDER — MISOPROSTOL 50MCG HALF TABLET
50.0000 ug | ORAL_TABLET | Freq: Once | ORAL | Status: AC
  Administered 2024-01-01: 50 ug via ORAL
  Filled 2024-01-01: qty 1

## 2024-01-01 MED ORDER — MAGNESIUM SULFATE BOLUS VIA INFUSION
4.0000 g | Freq: Once | INTRAVENOUS | Status: AC
  Administered 2024-01-01: 4 g via INTRAVENOUS
  Filled 2024-01-01: qty 1000

## 2024-01-01 MED ORDER — LACTATED RINGERS IV SOLN: 500.0000 mL | INTRAVENOUS | Status: AC | PRN

## 2024-01-01 NOTE — Progress Notes (Signed)
Pt presents for bp check.

## 2024-01-01 NOTE — Progress Notes (Addendum)
 LABOR PROGRESS NOTE  Patient Name: Adriana Decker, female   DOB: 04-17-2001, 23 y.o.  MRN: [id number]  Called for two severe range Bps Will treat with IV labetalol.   Blood pressure (!) 161/102, pulse 69, temperature 99.1 F (37.3 C), temperature source Oral, resp. rate 16, height 5\' 5"  (1.651 m), weight 128 kg, last menstrual period 04/09/2023, SpO2 100%.  GHTN now with severe range BP > Preeclampsia with severe range blood pressure - labetalol protocol - Magnesium for seizure prophylaxis - q12hr labs, next at 5 am  Darrow End, MD

## 2024-01-01 NOTE — Progress Notes (Signed)
 Subjective:  Adriana Decker is a 23 y.o. G1P0 at [redacted]w[redacted]d being seen today for ongoing prenatal care.  She is currently monitored for the following issues for this low-risk pregnancy and has Encounter for supervision of normal first pregnancy in first trimester; Pregnancy complicated by obesity; Anemia in pregnancy; and Excessive weight gain during pregnancy, antepartum on their problem list.  Patient reports no complaints.  Contractions: Irritability. Vag. Bleeding: None, Scant (little spot).  Movement: Present. Denies leaking of fluid.   The following portions of the patient's history were reviewed and updated as appropriate: allergies, current medications, past family history, past medical history, past social history, past surgical history and problem list. Problem list updated.  Objective:   Vitals:   12/31/23 1514 12/31/23 1519  BP: (!) 163/101 (!) 147/103  Pulse: 71 73  Weight: 247 lb 12.8 oz (112.4 kg)     Fetal Status: Fetal Heart Rate (bpm): 154   Movement: Present     General:  Alert, oriented and cooperative. Patient is in no acute distress.  Skin: Skin is warm and dry. No rash noted.   Cardiovascular: Normal heart rate noted  Respiratory: Normal respiratory effort, no problems with respiration noted  Abdomen: Soft, gravid, appropriate for gestational age. Pain/Pressure: Absent     Pelvic:  Cervical exam deferred        Extremities: Normal range of motion.  Edema: Mild pitting, slight indentation (Feet, hands, face)  Mental Status: Normal mood and affect. Normal behavior. Normal judgment and thought content.   Urinalysis:      Assessment and Plan:  Pregnancy: G1P0 at [redacted]w[redacted]d  1. Supervision of normal first pregnancy, antepartum (Primary)  2. Elevated BP without diagnosis of hypertension - patient left without further work up.  She will be contacted for pre-eclampsia work up and possible admission for delivery if positive work up  3. Anemia during pregnancy in third  trimester  4. Obesity affecting pregnancy, antepartum, unspecified obesity type    Term labor symptoms and general obstetric precautions including but not limited to vaginal bleeding, contractions, leaking of fluid and fetal movement were reviewed in detail with the patient. Please refer to After Visit Summary for other counseling recommendations.   Return in about 1 week (around 01/07/2024) for ROB.   Gabrielle Joiner, MD 12/31/2023

## 2024-01-01 NOTE — Progress Notes (Signed)
 Labor Note  S: Patient doing well on birthing ball. She has no complaints.   Blood pressure (!) 153/96, pulse 68, temperature 98.1 F (36.7 C), temperature source Oral, resp. rate 16, height 5\' 5"  (1.651 m), weight 128 kg, last menstrual period 04/09/2023, SpO2 100%.  FHT: 150, mod var + accels, no decels Toco: Q2-3 min CE: 2.5/70/-3, soft. FB placed with 60 cc of normal saline. Patient tolerated well.   Plan: # Labor - FB risks/benefits discussed with patient and support team (mom and FOB). Discussed option for pain medication prior. She consented to placement and did not want pain medication. FB placed without difficulty.  - Miso not yet due.     #GHTN - Reviewed VS: Mild range Bps.  - Labs normal  Lacey Pian, MD Attending Obstetrician & Gynecologist, [facility] for [facility], [facility]

## 2024-01-01 NOTE — Telephone Encounter (Signed)
 Telephone call to patient.  Left detailed message that Dr. April Knack wanted her to return today for blood pressure check and lab work.  I left my direct number for call back.   I advised patient if she is not able to come to the office that we would want her to go to [facility] for further evaluation.   Requested call back.

## 2024-01-01 NOTE — Progress Notes (Signed)
   LOW-RISK PREGNANCY OFFICE VISIT  Patient name: Adriana Decker MRN [id number]  Date of birth: February 16, 2001 Chief Complaint:   No chief complaint on file.  Subjective:   Adriana Decker is a 23 y.o. G1P0 female at [redacted]w[redacted]d with an Estimated Date of Delivery: 01/14/24 being seen today for ongoing management of a low-risk pregnancy aeb has Encounter for supervision of normal first pregnancy in first trimester; Pregnancy complicated by obesity; Anemia in pregnancy; and Excessive weight gain during pregnancy, antepartum on their problem list.  Patient presents today, with SO, for repeat bp check.  Patient endorses fetal movement. Patient reports abdominal cramping and contractions earlier, but none currently.  Patient denies vaginal concerns including abnormal discharge, leaking of fluid, and bleeding. However, she does report some spotting with the loss of her mucous plug.  Patient reports some lightheadedness, but denies visual disturbances and HA.    Contractions: Irritability. Vag. Bleeding: None.  Movement: Present.  Reviewed past medical,surgical, social, obstetrical and family history as well as problem list, medications and allergies.  Objective   Vitals:   01/01/24 1349  BP: (!) 141/102  Pulse: 85  Weight: 282 lb 14.4 oz (128.3 kg)  Body mass index is 47.08 kg/m.  Total Weight Gain:81 lb 14.4 oz (37.1 kg)         Physical Examination:   General appearance: Well appearing, and in no distress  Mental status: Alert, oriented to person, place, and time  Skin: Warm & dry  Cardiovascular: Normal heart rate noted  Respiratory: Normal respiratory effort, no distress  Abdomen: Soft, gravid,   Pelvic: Cervical exam deferred           Extremities: Edema: Mild pitting, slight indentation  Fetal Status:    Movement: Present   No results found for this or any previous visit (from the past 24 hours).  Assessment & Plan:  Low-risk pregnancy of a 23 y.o., G1P0 at [redacted]w[redacted]d with an Estimated Date  of Delivery: 01/14/24   1. Encounter for supervision of normal first pregnancy in third trimester -Anticipatory guidance for upcoming appts. -Patient to schedule next appt in - weeks for a PP visit.  2. Gestational hypertension, third trimester -Informed of continued elevated bp and severe yesterday which is c/w GHTN diagnosis. -Contacted labor team who accepts patient.  -L&D team notified and will attempt to accommodate based on census. -MAU team notified in case patient requires monitoring while awaiting L&D bed. -Patient instructed to go directly to [facility].  -Admission orders placed.      Meds: No orders of the defined types were placed in this encounter.  Labs/procedures today:  Lab Orders  No laboratory test(s) ordered today     Reviewed: Term labor symptoms and general obstetric precautions including but not limited to vaginal bleeding, contractions, leaking of fluid and fetal movement were reviewed in detail with the patient.  All questions were answered.  Follow-up: No follow-ups on file.  No orders of the defined types were placed in this encounter.  Kraig Peru MSN, CNM 01/01/2024

## 2024-01-01 NOTE — H&P (Signed)
 OBSTETRIC ADMISSION HISTORY AND PHYSICAL  Adriana Decker is a 23 y.o. female G1P0 with IUP at [redacted]w[redacted]d by LMP presenting for IOL for GHTN. She reports +FMs, No LOF, no VB, no blurry vision, headaches or peripheral edema, and RUQ pain.  She plans on breast feeding. She declines birth control. She received her prenatal care at [facility]   Dating: By LMP --->  Estimated Date of Delivery: 01/14/24  Sono:   @[redacted]w[redacted]d , CWD, normal female anatomy, transverse with head to maternal right presentation, posterior placental lie, 2109 gm 4 lb 10 oz 69 % EFW   Prenatal History/Complications:  - GHTN - BMI 36 - Anemia Hb 10.6  Past Medical History: Past Medical History:  Diagnosis Date   Ankle fracture    right   Asthma    " as a baby"   Headache    Hearing deficit, left    Obesity    Pneumonia    " as a baby"    Past Surgical History: Past Surgical History:  Procedure Laterality Date   ORIF ANKLE FRACTURE Right 12/03/2016   [facility]   ORIF ANKLE FRACTURE Right 12/03/2016   Procedure: OPEN REDUCTION INTERNAL FIXATION (ORIF) ANKLE FRACTURE;  Surgeon: Janeth Medicus, MD;  Location: [facility] OR;  Service: Orthopedics;  Laterality: Right;   WISDOM TOOTH EXTRACTION      Obstetrical History: OB History     Gravida  1   Para      Term      Preterm      AB      Living         SAB      IAB      Ectopic      Multiple      Live Births              Social History Social History   Socioeconomic History   Marital status: Single    Spouse name: Not on file   Number of children: Not on file   Years of education: Not on file   Highest education level: Not on file  Occupational History   Not on file  Tobacco Use   Smoking status: Never   Smokeless tobacco: Never  Vaping Use   Vaping status: Former   Substances: Nicotine, Flavoring   Devices: not since confirmed pregnancy  Substance and Sexual Activity   Alcohol use: Not Currently    Comment: not since  confirmed pregnancy   Drug use: No   Sexual activity: Yes    Partners: Male    Comment: currently pregnant  Other Topics Concern   Not on file  Social History Narrative   Pt is in the 10 th grade and lives at home with both parents and siblings.   Patient has 26 yo brother amd 41 yo brother. No smokers in the home. 1 cat sometimes stays in the home.   Social Drivers of Corporate investment banker Strain: Not on file  Food Insecurity: No Food Insecurity (01/01/2024)   Hunger Vital Sign    Worried About Running Out of Food in the Last Year: Never true    Ran Out of Food in the Last Year: Never true  Transportation Needs: No Transportation Needs (01/01/2024)   PRAPARE - Administrator, Civil Service (Medical): No    Lack of Transportation (Non-Medical): No  Physical Activity: Not on file  Stress: Not on file  Social Connections: Unknown (12/25/2021)   Received  from [facility]   Social Network    Social Network: Not on file    Family History: Family History  Problem Relation Age of Onset   High blood pressure Mother    Asthma Brother    Asthma Brother    Hypertension Maternal Grandmother    Hypertension Maternal Grandfather    Hypertension Paternal Grandmother    Asthma Paternal Grandmother     Allergies: Allergies  Allergen Reactions   No Known Allergies     Medications Prior to Admission  Medication Sig Dispense Refill Last Dose/Taking   Ferric Maltol  (ACCRUFER ) 30 MG CAPS Take 1 capsule (30 mg total) by mouth 2 (two) times daily. 60 capsule 5 01/01/2024   Prenat-Fe Poly-Methfol-FA-DHA (VITAFOL  ULTRA) 29-0.6-0.4-200 MG CAPS Take 1 capsule by mouth daily. 30 capsule 11 01/01/2024   acetaminophen  (TYLENOL ) 500 MG tablet Take 500 mg by mouth every 6 (six) hours as needed.      aspirin  EC 81 MG tablet Take 1 tablet (81 mg total) by mouth daily. Swallow whole. (Patient not taking: Reported on 12/31/2023) 30 tablet 12    Blood Pressure Monitoring (BLOOD PRESSURE  KIT) DEVI 1 kit by Does not apply route once a week. (Patient not taking: Reported on 12/16/2023) 1 each 0    Misc. Devices (GOJJI WEIGHT SCALE) MISC 1 Device by Does not apply route every 30 (thirty) days. (Patient not taking: Reported on 08/12/2023) 1 each 0    ondansetron  (ZOFRAN ) 4 MG tablet Take 1 tablet (4 mg total) by mouth every 8 (eight) hours as needed for nausea or vomiting. (Patient not taking: Reported on 08/12/2023) 20 tablet 0      Review of Systems   All systems reviewed and negative except as stated in HPI  Blood pressure (!) 141/87, pulse 79, temperature 99.2 F (37.3 C), temperature source Oral, resp. rate 16, height 5\' 5"  (1.651 m), weight 128 kg, last menstrual period 04/09/2023, SpO2 100%. General appearance: alert, cooperative, appears stated age, and mild distress Lungs: clear to auscultation bilaterally Heart: regular rate and rhythm Abdomen: soft, non-tender; bowel sounds normal Pelvic: adequate, unproven Extremities: Homans sign is negative, no sign of DVT DTR's 2+ Presentation: cephalic Fetal monitoringBaseline: 145 bpm, Variability: Good {> 6 bpm), Accelerations: Reactive, and Decelerations: Absent Uterine activityFrequency: Every 2-5 minutes Dilation: 1 Effacement (%): 30 Station: -3 Exam by:: Charlynne Coombes RN   Prenatal labs: ABO, Rh: A/Positive/-- (11/06 1143) Antibody: Negative (11/06 1143) Rubella: 2.55 (11/06 1143) RPR: Non Reactive (03/25 0919)  HBsAg: Negative (11/06 1143)  HIV: Non Reactive (03/25 0919)  GBS: Negative/-- (05/13 1547)    Lab Results  Component Value Date   GBS Negative 12/23/2023   GTT normal Genetic screening  low risk female, negative AFP Anatomy US  normal  Immunization History  Administered Date(s) Administered   Tdap 11/04/2023    Prenatal Transfer Tool  Maternal Diabetes: No Genetic Screening: Normal Maternal Ultrasounds/Referrals: Normal Fetal Ultrasounds or other Referrals:  None Maternal Substance  Abuse:  No Significant Maternal Medications:  None Significant Maternal Lab Results: Group B Strep negative Number of Prenatal Visits:greater than 3 verified prenatal visits Maternal Vaccinations:TDap, Flu, and Covid Other Comments:  None   Results for orders placed or performed during the hospital encounter of 01/01/24 (from the past 24 hours)  CBC   Collection Time: 01/01/24  4:11 PM  Result Value Ref Range   WBC 11.0 (H) 4.0 - 10.5 K/uL   RBC 4.53 3.87 - 5.11 MIL/uL   Hemoglobin 9.7 (  L) 12.0 - 15.0 g/dL   HCT 91.4 (L) 78.2 - 95.6 %   MCV 70.9 (L) 80.0 - 100.0 fL   MCH 21.4 (L) 26.0 - 34.0 pg   MCHC 30.2 30.0 - 36.0 g/dL   RDW 21.3 (H) 08.6 - 57.8 %   Platelets 287 150 - 400 K/uL   nRBC 0.6 (H) 0.0 - 0.2 %    Patient Active Problem List   Diagnosis Date Noted   Gestational hypertension, third trimester 01/01/2024   Anemia in pregnancy 10/07/2023   Excessive weight gain during pregnancy, antepartum 10/07/2023   Pregnancy complicated by obesity 09/17/2023   Encounter for supervision of normal first pregnancy in first trimester 06/18/2023    Assessment/Plan:  Tereza Gren is a 23 y.o. G1P0 at [redacted]w[redacted]d here for IOL for GHTN  #Labor:Dual Cytotec > FB > AROM > Pitocin as indicated by SVE #Pain: Per pt request #FWB: Cat I #GBS status:  negative #Feeding: Breastmilk  #Reproductive Life planning: None #Circ:  yes  #GHTN - preeclampsia labs pending, mild range BP, asymptomatic  Darrow End, MD  01/01/2024, 4:51 PM

## 2024-01-02 ENCOUNTER — Inpatient Hospital Stay (HOSPITAL_COMMUNITY): Admitting: Anesthesiology

## 2024-01-02 LAB — COMPREHENSIVE METABOLIC PANEL WITH GFR
ALT: 16 U/L (ref 0–44)
ALT: 19 U/L (ref 0–44)
AST: 53 U/L — ABNORMAL HIGH (ref 15–41)
AST: 42 U/L — ABNORMAL HIGH (ref 15–41)
Albumin: 1.9 g/dL — ABNORMAL LOW (ref 3.5–5.0)
Albumin: 2 g/dL — ABNORMAL LOW (ref 3.5–5.0)
Alkaline Phosphatase: 113 U/L (ref 38–126)
Alkaline Phosphatase: 122 U/L (ref 38–126)
Anion gap: 10 (ref 5–15)
Anion gap: 10 (ref 5–15)
BUN: <5 mg/dL — ABNORMAL LOW (ref 6–20)
BUN: 5 mg/dL — ABNORMAL LOW (ref 6–20)
CO2: 20 mmol/L — ABNORMAL LOW (ref 22–32)
CO2: 21 mmol/L — ABNORMAL LOW (ref 22–32)
Calcium: 7.6 mg/dL — ABNORMAL LOW (ref 8.9–10.3)
Calcium: 7.5 mg/dL — ABNORMAL LOW (ref 8.9–10.3)
Chloride: 104 mmol/L (ref 98–111)
Chloride: 103 mmol/L (ref 98–111)
Creatinine, Ser: 0.7 mg/dL (ref 0.44–1.00)
Creatinine, Ser: 0.79 mg/dL (ref 0.44–1.00)
GFR, Estimated: >60 mL/min (ref >60)
GFR, Estimated: >60 mL/min (ref >60)
Glucose, Bld: 96 mg/dL (ref 70–99)
Glucose, Bld: 103 mg/dL — ABNORMAL HIGH (ref 70–99)
Potassium: 4.5 mmol/L (ref 3.5–5.1)
Potassium: 4.7 mmol/L (ref 3.5–5.1)
Sodium: 134 mmol/L — ABNORMAL LOW (ref 135–145)
Sodium: 134 mmol/L — ABNORMAL LOW (ref 135–145)
Total Bilirubin: 0.8 mg/dL (ref 0.0–1.2)
Total Bilirubin: 0.7 mg/dL (ref 0.0–1.2)
Total Protein: 6 g/dL — ABNORMAL LOW (ref 6.5–8.1)
Total Protein: 6 g/dL — ABNORMAL LOW (ref 6.5–8.1)

## 2024-01-02 LAB — CBC
HCT: 32.2 % — ABNORMAL LOW (ref 36.0–46.0)
HCT: 33.1 % — ABNORMAL LOW (ref 36.0–46.0)
Hemoglobin: 9.7 g/dL — ABNORMAL LOW (ref 12.0–15.0)
Hemoglobin: 9.8 g/dL — ABNORMAL LOW (ref 12.0–15.0)
MCH: 21.3 pg — ABNORMAL LOW (ref 26.0–34.0)
MCH: 20.8 pg — ABNORMAL LOW (ref 26.0–34.0)
MCHC: 30.1 g/dL (ref 30.0–36.0)
MCHC: 29.6 g/dL — ABNORMAL LOW (ref 30.0–36.0)
MCV: 70.6 fL — ABNORMAL LOW (ref 80.0–100.0)
MCV: 70.3 fL — ABNORMAL LOW (ref 80.0–100.0)
Platelets: 273 10*3/uL (ref 150–400)
Platelets: 322 10*3/uL (ref 150–400)
RBC: 4.56 MIL/uL (ref 3.87–5.11)
RBC: 4.71 MIL/uL (ref 3.87–5.11)
RDW: 19.1 % — ABNORMAL HIGH (ref 11.5–15.5)
RDW: 19.5 % — ABNORMAL HIGH (ref 11.5–15.5)
WBC: 14.1 10*3/uL — ABNORMAL HIGH (ref 4.0–10.5)
WBC: 14.9 10*3/uL — ABNORMAL HIGH (ref 4.0–10.5)
nRBC: 0.3 % — ABNORMAL HIGH (ref 0.0–0.2)
nRBC: 0 % (ref 0.0–0.2)

## 2024-01-02 LAB — ABO/RH: ABO/RH(D): A POS

## 2024-01-02 LAB — RPR: RPR Ser Ql: NONREACTIVE

## 2024-01-02 MED ORDER — OXYTOCIN-SODIUM CHLORIDE 30-0.9 UT/500ML-% IV SOLN
1.0000 m[IU]/min | INTRAVENOUS | Status: DC
  Administered 2024-01-02: 2 m[IU]/min via INTRAVENOUS
  Filled 2024-01-02: qty 500

## 2024-01-02 MED ORDER — PHENYLEPHRINE 80 MCG/ML (10ML) SYRINGE FOR IV PUSH (FOR BLOOD PRESSURE SUPPORT)
80.0000 ug | PREFILLED_SYRINGE | INTRAVENOUS | Status: AC | PRN
  Administered 2024-01-02 (×3): 80 ug via INTRAVENOUS
  Filled 2024-01-02: qty 10

## 2024-01-02 MED ORDER — LIDOCAINE-EPINEPHRINE (PF) 1.5 %-1:200000 IJ SOLN
INTRAMUSCULAR | Status: DC | PRN
  Administered 2024-01-02: 2 mL via EPIDURAL
  Administered 2024-01-02: 3 mL via EPIDURAL

## 2024-01-02 MED ORDER — EPHEDRINE 5 MG/ML INJ: 10.0000 mg | INTRAVENOUS | Status: DC | PRN

## 2024-01-02 MED ORDER — PHENYLEPHRINE 80 MCG/ML (10ML) SYRINGE FOR IV PUSH (FOR BLOOD PRESSURE SUPPORT): 80.0000 ug | PREFILLED_SYRINGE | INTRAVENOUS | Status: DC | PRN

## 2024-01-02 MED ORDER — DIPHENHYDRAMINE HCL 50 MG/ML IJ SOLN: 12.5000 mg | INTRAMUSCULAR | Status: DC | PRN

## 2024-01-02 MED ORDER — LACTATED RINGERS IV SOLN
500.0000 mL | Freq: Once | INTRAVENOUS | Status: AC
  Administered 2024-01-02: 500 mL via INTRAVENOUS

## 2024-01-02 MED ORDER — FENTANYL-BUPIVACAINE-NACL 0.5-0.125-0.9 MG/250ML-% EP SOLN
12.0000 mL/h | EPIDURAL | Status: DC | PRN
  Administered 2024-01-02: 12 mL/h via EPIDURAL
  Filled 2024-01-02: qty 250

## 2024-01-02 MED ORDER — GUAIFENESIN ER 600 MG PO TB12
600.0000 mg | ORAL_TABLET | Freq: Two times a day (BID) | ORAL | Status: DC | PRN
Start: 2024-01-02 — End: 2024-01-08
  Administered 2024-01-02 – 2024-01-03 (×2): 600 mg via ORAL
  Filled 2024-01-02 (×4): qty 1

## 2024-01-02 NOTE — Progress Notes (Addendum)
 Labor Progress Note Adriana Decker is a 23 y.o. G1P0 at [redacted]w[redacted]d presented for IOL for gHTN  S: Patient evaluated at bedside. Reports that contractions have become more painful and frequent while on Pitocin. She is amenable to cervical exam.  O:  BP (!) 155/89   Pulse 69   Temp 99.2 F (37.3 C) (Oral)   Resp 18   Ht 5\' 5"  (1.651 m)   Wt 128 kg   LMP 04/09/2023   SpO2 96%   BMI 46.96 kg/m  EFM: Baseline 125/moderate variability/q3-4 minutes  CVE: Dilation: 7 Effacement (%): 60 Station: -3 Presentation: Vertex Exam by:: Marna Singleton RN   A&P: 23 y.o. G1P0 [redacted]w[redacted]d for IOL for gHTN #Labor: Cervix remains unchanged, has been 7cm since 0450 this AM despite augmentation with Pitocin. Will place an IUPC to assess adequacy of contractions. #Pain: unmedicated, discussed IV pain medicines are not recommended this close to delivery #FWB: Category 1 #GBS negative  #gHTN - Has had elevated pressures, but none severe range. Continue monitoring  Frutoso Jing, MD 6:31 PM

## 2024-01-02 NOTE — Progress Notes (Signed)
 Labor Note  S: Resting comfortably. FB is out. She would like to proceed with AROM  Blood pressure 133/84, pulse 73, temperature 99.1 F (37.3 C), temperature source Oral, resp. rate 16, height 5\' 5"  (1.651 m), weight 128 kg, last menstrual period 04/09/2023, SpO2 96%.  FHT: 150, mod var + accels, no decels Toco: Q2-4 min CE: 5/60/-3, AROM to thick mec  Plan: #Labor Since contracting will see if AROM is sufficient for augmentation. If not, we discussed we would then recommend adding in Pitocin.  FHT category 1 Discussed meconium  #PreE with SF Labs at 0500 Magnesium Labetalol 200 BID  All questions answered.   Lacey Pian, MD Attending Obstetrician & Gynecologist, [facility] for [facility], [facility]

## 2024-01-02 NOTE — Progress Notes (Signed)
 Labor Progress Note Adriana Decker is a 23 y.o. G1P0 at [redacted]w[redacted]d presented for IOL for gHTN  S: Patient evaluated at bedside. She is breathing through contractions. Nursing performed cervical exam, patient declined repeat exam.  O:  BP (!) 132/92 (BP Location: Left Arm)   Pulse 70   Temp 99.1 F (37.3 C) (Oral)   Resp 18   Ht 5\' 5"  (1.651 m)   Wt 128 kg   LMP 04/09/2023   SpO2 96%   BMI 46.96 kg/m  EFM: Baseline 120/moderate variability/accels/ no decels/ irregular cx  CVE: Dilation: 7 Effacement (%): 60 Station: -3 Presentation: Vertex Exam by:: Marna Singleton RN   A&P: 23 y.o. G1P0 [redacted]w[redacted]d for IOL of gHTN #Labor: Patient has had no progress since last check. Contracting irregularly. Discussed starting low dose Pitocin for augmentation #Pain: per patient request #FWB: Category 1 #GBS negative  Frutoso Jing, MD 1:17 PM

## 2024-01-02 NOTE — Anesthesia Procedure Notes (Signed)
 Epidural Patient location during procedure: OB Start time: 01/02/2024 10:16 PM End time: 01/02/2024 10:38 PM  Staffing Anesthesiologist: Gorman Laughter, MD Performed: anesthesiologist   Preanesthetic Checklist Completed: patient identified, IV checked, risks and benefits discussed, monitors and equipment checked, pre-op evaluation and timeout performed  Epidural Patient position: sitting Prep: DuraPrep and site prepped and draped Patient monitoring: heart rate, continuous pulse ox and blood pressure Approach: midline Location: L3-L4 Injection technique: LOR air and LOR saline  Needle:  Needle type: Tuohy  Needle gauge: 17 G Needle length: 9 cm Needle insertion depth: 7 cm Catheter type: closed end flexible Catheter size: 19 Gauge Catheter at skin depth: 13 cm Test dose: negative and 1.5% lidocaine  with Epi 1:200 K  Assessment Sensory level: T8 Events: blood not aspirated, no cerebrospinal fluid, injection not painful, no injection resistance, no paresthesia and negative IV test  Additional Notes Reason for block:procedure for pain

## 2024-01-02 NOTE — Anesthesia Preprocedure Evaluation (Addendum)
 Anesthesia Evaluation  Patient identified by MRN, date of birth, ID band Patient awake    Reviewed: Allergy & Precautions, H&P , Patient's Chart, lab work & pertinent test results  Airway Mallampati: III   Neck ROM: full    Dental  (+) Teeth Intact, Dental Advisory Given   Pulmonary asthma , pneumonia   breath sounds clear to auscultation       Cardiovascular hypertension,  Rhythm:regular Rate:Normal     Neuro/Psych  Headaches    GI/Hepatic   Endo/Other    Class 3 obesity  Renal/GU      Musculoskeletal   Abdominal  (+) + obese  Peds  Hematology  (+) Blood dyscrasia, anemia   Anesthesia Other Findings   Reproductive/Obstetrics                             Anesthesia Physical Anesthesia Plan  ASA: 3  Anesthesia Plan: Epidural   Post-op Pain Management: Ofirmev  IV (intra-op)* and Precedex   Induction:   PONV Risk Score and Plan: 4 or greater and Ondansetron , Dexamethasone  and Treatment may vary due to age or medical condition  Airway Management Planned: Natural Airway  Additional Equipment: None  Intra-op Plan:   Post-operative Plan:   Informed Consent: I have reviewed the patients History and Physical, chart, labs and discussed the procedure including the risks, benefits and alternatives for the proposed anesthesia with the patient or authorized representative who has indicated his/her understanding and acceptance.     Dental advisory given  Plan Discussed with: CRNA  Anesthesia Plan Comments:         Anesthesia Quick Evaluation

## 2024-01-03 ENCOUNTER — Encounter (HOSPITAL_COMMUNITY): Payer: Self-pay | Admitting: Family Medicine

## 2024-01-03 ENCOUNTER — Encounter (HOSPITAL_COMMUNITY)
Admission: AD | Disposition: A | Discharge status: Home / Self Care | Payer: Self-pay | Source: Home / Self Care | Attending: Family Medicine

## 2024-01-03 ENCOUNTER — Other Ambulatory Visit: Payer: Self-pay

## 2024-01-03 DIAGNOSIS — Z98891 History of uterine scar from previous surgery: Principal | ICD-10-CM

## 2024-01-03 DIAGNOSIS — Z3A38 38 weeks gestation of pregnancy: Secondary | ICD-10-CM

## 2024-01-03 DIAGNOSIS — O26613 Liver and biliary tract disorders in pregnancy, third trimester: Secondary | ICD-10-CM | POA: Diagnosis not present

## 2024-01-03 DIAGNOSIS — O1414 Severe pre-eclampsia complicating childbirth: Secondary | ICD-10-CM | POA: Diagnosis not present

## 2024-01-03 LAB — COMPREHENSIVE METABOLIC PANEL WITH GFR
ALT: 16 U/L (ref 0–44)
AST: 32 U/L (ref 15–41)
Albumin: 1.7 g/dL — ABNORMAL LOW (ref 3.5–5.0)
Alkaline Phosphatase: 103 U/L (ref 38–126)
Anion gap: 14 (ref 5–15)
BUN: 9 mg/dL (ref 6–20)
CO2: 20 mmol/L — ABNORMAL LOW (ref 22–32)
Calcium: 7.1 mg/dL — ABNORMAL LOW (ref 8.9–10.3)
Chloride: 99 mmol/L (ref 98–111)
Creatinine, Ser: 0.95 mg/dL (ref 0.44–1.00)
GFR, Estimated: >60 mL/min (ref >60)
Glucose, Bld: 99 mg/dL (ref 70–99)
Potassium: 4.2 mmol/L (ref 3.5–5.1)
Sodium: 133 mmol/L — ABNORMAL LOW (ref 135–145)
Total Bilirubin: 0.8 mg/dL (ref 0.0–1.2)
Total Protein: 5.1 g/dL — ABNORMAL LOW (ref 6.5–8.1)

## 2024-01-03 LAB — CBC
HCT: 29.9 % — ABNORMAL LOW (ref 36.0–46.0)
Hemoglobin: 8.9 g/dL — ABNORMAL LOW (ref 12.0–15.0)
MCH: 21.1 pg — ABNORMAL LOW (ref 26.0–34.0)
MCHC: 29.8 g/dL — ABNORMAL LOW (ref 30.0–36.0)
MCV: 71 fL — ABNORMAL LOW (ref 80.0–100.0)
Platelets: 315 10*3/uL (ref 150–400)
RBC: 4.21 MIL/uL (ref 3.87–5.11)
RDW: 19.2 % — ABNORMAL HIGH (ref 11.5–15.5)
WBC: 22.8 10*3/uL — ABNORMAL HIGH (ref 4.0–10.5)
nRBC: 0 % (ref 0.0–0.2)

## 2024-01-03 LAB — MAGNESIUM: Magnesium: 8.6 mg/dL (ref 1.7–2.4)

## 2024-01-03 SURGERY — Surgical Case
Anesthesia: Epidural

## 2024-01-03 MED ORDER — NALOXONE HCL 4 MG/10ML IJ SOLN: 1.0000 ug/kg/h | INTRAVENOUS | Status: DC | PRN

## 2024-01-03 MED ORDER — OXYTOCIN-SODIUM CHLORIDE 30-0.9 UT/500ML-% IV SOLN
INTRAVENOUS | Status: AC
  Filled 2024-01-03: qty 500

## 2024-01-03 MED ORDER — ACETAMINOPHEN 500 MG PO TABS
1000.0000 mg | ORAL_TABLET | Freq: Four times a day (QID) | ORAL | Status: DC
  Administered 2024-01-03 – 2024-01-07 (×14): 1000 mg via ORAL
  Filled 2024-01-03 (×16): qty 2

## 2024-01-03 MED ORDER — DIBUCAINE (PERIANAL) 1 % EX OINT: 1.0000 | TOPICAL_OINTMENT | CUTANEOUS | Status: DC | PRN

## 2024-01-03 MED ORDER — FUROSEMIDE 40 MG PO TABS
40.0000 mg | ORAL_TABLET | Freq: Every day | ORAL | Status: DC
  Administered 2024-01-03 – 2024-01-08 (×6): 40 mg via ORAL
  Filled 2024-01-03 (×6): qty 1

## 2024-01-03 MED ORDER — ONDANSETRON HCL 4 MG/2ML IJ SOLN
INTRAMUSCULAR | Status: DC | PRN
  Administered 2024-01-03: 4 mg via INTRAVENOUS

## 2024-01-03 MED ORDER — PHENYLEPHRINE 80 MCG/ML (10ML) SYRINGE FOR IV PUSH (FOR BLOOD PRESSURE SUPPORT)
PREFILLED_SYRINGE | INTRAVENOUS | Status: DC | PRN
  Administered 2024-01-03: 160 ug via INTRAVENOUS

## 2024-01-03 MED ORDER — MAGNESIUM HYDROXIDE 400 MG/5ML PO SUSP: 30.0000 mL | ORAL | Status: DC | PRN

## 2024-01-03 MED ORDER — MORPHINE SULFATE (PF) 0.5 MG/ML IJ SOLN
INTRAMUSCULAR | Status: DC | PRN
Start: 2024-01-03 — End: 2024-01-03
  Administered 2024-01-03: 3 mg via EPIDURAL

## 2024-01-03 MED ORDER — PRENATAL MULTIVITAMIN CH
1.0000 | ORAL_TABLET | Freq: Every day | ORAL | Status: DC
  Administered 2024-01-03 – 2024-01-07 (×5): 1 via ORAL
  Filled 2024-01-03 (×6): qty 1

## 2024-01-03 MED ORDER — ONDANSETRON HCL 4 MG/2ML IJ SOLN
INTRAMUSCULAR | Status: AC
  Filled 2024-01-03: qty 2

## 2024-01-03 MED ORDER — LABETALOL HCL 200 MG PO TABS
200.0000 mg | ORAL_TABLET | Freq: Two times a day (BID) | ORAL | Status: DC
  Administered 2024-01-03: 200 mg via ORAL
  Filled 2024-01-03 (×2): qty 1

## 2024-01-03 MED ORDER — SIMETHICONE 80 MG PO CHEW
80.0000 mg | CHEWABLE_TABLET | Freq: Three times a day (TID) | ORAL | Status: DC
  Administered 2024-01-03 – 2024-01-08 (×13): 80 mg via ORAL
  Filled 2024-01-03 (×14): qty 1

## 2024-01-03 MED ORDER — MEPERIDINE HCL 25 MG/ML IJ SOLN: 6.2500 mg | INTRAMUSCULAR | Status: DC | PRN

## 2024-01-03 MED ORDER — DIPHENHYDRAMINE HCL 25 MG PO CAPS
25.0000 mg | ORAL_CAPSULE | ORAL | Status: DC | PRN
  Administered 2024-01-06: 25 mg via ORAL
  Filled 2024-01-03: qty 1

## 2024-01-03 MED ORDER — SODIUM CHLORIDE 0.9% FLUSH
3.0000 mL | INTRAVENOUS | Status: DC | PRN
  Administered 2024-01-05: 3 mL via INTRAVENOUS

## 2024-01-03 MED ORDER — DEXAMETHASONE SODIUM PHOSPHATE 10 MG/ML IJ SOLN
INTRAMUSCULAR | Status: DC | PRN
  Administered 2024-01-03: 10 mg via INTRAVENOUS

## 2024-01-03 MED ORDER — TRANEXAMIC ACID-NACL 1000-0.7 MG/100ML-% IV SOLN: 1000.0000 mg | Freq: Once | INTRAVENOUS | Status: DC

## 2024-01-03 MED ORDER — SENNOSIDES-DOCUSATE SODIUM 8.6-50 MG PO TABS
2.0000 | ORAL_TABLET | Freq: Every day | ORAL | Status: DC
  Administered 2024-01-03 – 2024-01-08 (×5): 2 via ORAL
  Filled 2024-01-03 (×6): qty 2

## 2024-01-03 MED ORDER — IBUPROFEN 600 MG PO TABS
600.0000 mg | ORAL_TABLET | Freq: Four times a day (QID) | ORAL | Status: DC
  Administered 2024-01-04 – 2024-01-06 (×10): 600 mg via ORAL
  Filled 2024-01-03 (×10): qty 1

## 2024-01-03 MED ORDER — LACTATED RINGERS IV SOLN: INTRAVENOUS | Status: AC

## 2024-01-03 MED ORDER — STERILE WATER FOR IRRIGATION IR SOLN
Status: DC | PRN
  Administered 2024-01-03: 1000 mL

## 2024-01-03 MED ORDER — ENOXAPARIN SODIUM 80 MG/0.8ML IJ SOSY
65.0000 mg | PREFILLED_SYRINGE | INTRAMUSCULAR | Status: DC
  Administered 2024-01-04 – 2024-01-08 (×5): 65 mg via SUBCUTANEOUS
  Filled 2024-01-03 (×5): qty 0.8

## 2024-01-03 MED ORDER — KETOROLAC TROMETHAMINE 30 MG/ML IJ SOLN
30.0000 mg | Freq: Four times a day (QID) | INTRAMUSCULAR | Status: AC
  Administered 2024-01-03 – 2024-01-04 (×3): 30 mg via INTRAVENOUS
  Filled 2024-01-03 (×3): qty 1

## 2024-01-03 MED ORDER — KETOROLAC TROMETHAMINE 30 MG/ML IJ SOLN
30.0000 mg | Freq: Once | INTRAMUSCULAR | Status: AC | PRN
  Administered 2024-01-03: 30 mg via INTRAVENOUS

## 2024-01-03 MED ORDER — SODIUM CHLORIDE 0.9 % IV SOLN
INTRAVENOUS | Status: DC | PRN
  Administered 2024-01-03: 500 mg via INTRAVENOUS

## 2024-01-03 MED ORDER — HYDROMORPHONE HCL 1 MG/ML IJ SOLN
INTRAMUSCULAR | Status: AC
  Filled 2024-01-03: qty 0.5

## 2024-01-03 MED ORDER — CEFAZOLIN SODIUM-DEXTROSE 3-4 GM/150ML-% IV SOLN: 3.0000 g | INTRAVENOUS | Status: DC

## 2024-01-03 MED ORDER — TRANEXAMIC ACID-NACL 1000-0.7 MG/100ML-% IV SOLN
INTRAVENOUS | Status: AC
  Filled 2024-01-03: qty 100

## 2024-01-03 MED ORDER — ACETAMINOPHEN 10 MG/ML IV SOLN
INTRAVENOUS | Status: DC | PRN
  Administered 2024-01-03: 1000 mg via INTRAVENOUS

## 2024-01-03 MED ORDER — SOD CITRATE-CITRIC ACID 500-334 MG/5ML PO SOLN: 30.0000 mL | ORAL | Status: DC

## 2024-01-03 MED ORDER — GABAPENTIN 100 MG PO CAPS
200.0000 mg | ORAL_CAPSULE | Freq: Every day | ORAL | Status: DC
  Administered 2024-01-03 – 2024-01-07 (×5): 200 mg via ORAL
  Filled 2024-01-03 (×5): qty 2

## 2024-01-03 MED ORDER — LACTATED RINGERS IV SOLN: INTRAVENOUS | Status: DC | PRN

## 2024-01-03 MED ORDER — MAGNESIUM SULFATE 40 GM/1000ML IV SOLN
2.0000 g/h | INTRAVENOUS | Status: AC
  Administered 2024-01-03: 2 g/h via INTRAVENOUS
  Filled 2024-01-03: qty 1000

## 2024-01-03 MED ORDER — SCOPOLAMINE 1 MG/3DAYS TD PT72
1.0000 | MEDICATED_PATCH | Freq: Once | TRANSDERMAL | Status: AC
  Administered 2024-01-03: 1.5 mg via TRANSDERMAL
  Filled 2024-01-03: qty 1

## 2024-01-03 MED ORDER — SODIUM CHLORIDE 0.9% FLUSH
3.0000 mL | Freq: Two times a day (BID) | INTRAVENOUS | Status: DC
  Administered 2024-01-04 – 2024-01-05 (×3): 3 mL via INTRAVENOUS

## 2024-01-03 MED ORDER — WITCH HAZEL-GLYCERIN EX PADS: 1.0000 | MEDICATED_PAD | CUTANEOUS | Status: DC | PRN

## 2024-01-03 MED ORDER — DEXTROSE 5 % IV SOLN
INTRAVENOUS | Status: DC | PRN
  Administered 2024-01-03: 3 mg via INTRAVENOUS

## 2024-01-03 MED ORDER — LIDOCAINE-EPINEPHRINE (PF) 2 %-1:200000 IJ SOLN
INTRAMUSCULAR | Status: DC | PRN
Start: 2024-01-03 — End: 2024-01-03
  Administered 2024-01-03: 2 mL via EPIDURAL
  Administered 2024-01-03: 5 mL via EPIDURAL
  Administered 2024-01-03: 3 mL via EPIDURAL
  Administered 2024-01-03: 2 mL via EPIDURAL
  Administered 2024-01-03: 5 mL via EPIDURAL
  Administered 2024-01-03: 3 mL via EPIDURAL

## 2024-01-03 MED ORDER — HYDROMORPHONE HCL 1 MG/ML IJ SOLN
0.2500 mg | INTRAMUSCULAR | Status: DC | PRN
  Administered 2024-01-03: 0.5 mg via INTRAVENOUS

## 2024-01-03 MED ORDER — MENTHOL 3 MG MT LOZG: 1.0000 | LOZENGE | OROMUCOSAL | Status: DC | PRN

## 2024-01-03 MED ORDER — FENTANYL CITRATE (PF) 100 MCG/2ML IJ SOLN
INTRAMUSCULAR | Status: AC
  Filled 2024-01-03: qty 2

## 2024-01-03 MED ORDER — OXYTOCIN-SODIUM CHLORIDE 30-0.9 UT/500ML-% IV SOLN
INTRAVENOUS | Status: DC | PRN
  Administered 2024-01-03: 300 mL via INTRAVENOUS

## 2024-01-03 MED ORDER — KETOROLAC TROMETHAMINE 30 MG/ML IJ SOLN
INTRAMUSCULAR | Status: AC
  Filled 2024-01-03: qty 1

## 2024-01-03 MED ORDER — FENTANYL CITRATE (PF) 100 MCG/2ML IJ SOLN
INTRAMUSCULAR | Status: DC | PRN
  Administered 2024-01-03: 100 ug via EPIDURAL

## 2024-01-03 MED ORDER — SODIUM CHLORIDE 0.9 % IV SOLN
500.0000 mg | INTRAVENOUS | Status: DC
  Filled 2024-01-03: qty 5

## 2024-01-03 MED ORDER — SODIUM CHLORIDE 0.9% FLUSH: 3.0000 mL | INTRAVENOUS | Status: DC | PRN

## 2024-01-03 MED ORDER — MORPHINE SULFATE (PF) 0.5 MG/ML IJ SOLN
INTRAMUSCULAR | Status: AC
  Filled 2024-01-03: qty 10

## 2024-01-03 MED ORDER — SODIUM CHLORIDE 0.9 % IR SOLN
Status: DC | PRN
  Administered 2024-01-03: 1

## 2024-01-03 MED ORDER — SIMETHICONE 80 MG PO CHEW: 80.0000 mg | CHEWABLE_TABLET | ORAL | Status: DC | PRN

## 2024-01-03 MED ORDER — OXYTOCIN-SODIUM CHLORIDE 30-0.9 UT/500ML-% IV SOLN: 2.5000 [IU]/h | INTRAVENOUS | Status: DC

## 2024-01-03 MED ORDER — COCONUT OIL OIL: 1.0000 | TOPICAL_OIL | Status: DC | PRN

## 2024-01-03 MED ORDER — BUPIVACAINE HCL 0.25 % IJ SOLN
INTRAMUSCULAR | Status: DC | PRN
  Administered 2024-01-03: 30 mL

## 2024-01-03 MED ORDER — POTASSIUM CHLORIDE CRYS ER 20 MEQ PO TBCR: 40.0000 meq | EXTENDED_RELEASE_TABLET | Freq: Every day | ORAL | Status: DC

## 2024-01-03 MED ORDER — OXYCODONE HCL 5 MG PO TABS
5.0000 mg | ORAL_TABLET | Freq: Four times a day (QID) | ORAL | Status: DC | PRN
  Administered 2024-01-04: 5 mg via ORAL
  Administered 2024-01-06: 10 mg via ORAL
  Administered 2024-01-06: 5 mg via ORAL
  Filled 2024-01-03: qty 2
  Filled 2024-01-03 (×2): qty 1

## 2024-01-03 MED ORDER — DIPHENHYDRAMINE HCL 50 MG/ML IJ SOLN: 12.5000 mg | INTRAMUSCULAR | Status: DC | PRN

## 2024-01-03 MED ORDER — NALOXONE HCL 0.4 MG/ML IJ SOLN: 0.4000 mg | INTRAMUSCULAR | Status: DC | PRN

## 2024-01-03 MED ORDER — BUPIVACAINE HCL (PF) 0.25 % IJ SOLN
INTRAMUSCULAR | Status: AC
  Filled 2024-01-03: qty 40

## 2024-01-03 MED ORDER — DIPHENHYDRAMINE HCL 25 MG PO CAPS: 25.0000 mg | ORAL_CAPSULE | Freq: Four times a day (QID) | ORAL | Status: DC | PRN

## 2024-01-03 MED ORDER — ONDANSETRON HCL 4 MG/2ML IJ SOLN: 4.0000 mg | Freq: Three times a day (TID) | INTRAMUSCULAR | Status: DC | PRN

## 2024-01-03 MED ORDER — DROPERIDOL 2.5 MG/ML IJ SOLN: 0.6250 mg | Freq: Once | INTRAMUSCULAR | Status: DC | PRN

## 2024-01-03 MED ORDER — DEXAMETHASONE SODIUM PHOSPHATE 10 MG/ML IJ SOLN
INTRAMUSCULAR | Status: AC
  Filled 2024-01-03: qty 1

## 2024-01-03 SURGICAL SUPPLY — 29 items
BENZOIN TINCTURE PRP APPL 2/3 (GAUZE/BANDAGES/DRESSINGS) ×1 IMPLANT
CHLORAPREP W/TINT 26 (MISCELLANEOUS) ×2 IMPLANT
CLAMP UMBILICAL CORD (MISCELLANEOUS) ×1 IMPLANT
CLOTH BEACON ORANGE TIMEOUT ST (SAFETY) ×1 IMPLANT
DRSG OPSITE POSTOP 4X10 (GAUZE/BANDAGES/DRESSINGS) ×1 IMPLANT
ELECTRODE REM PT RTRN 9FT ADLT (ELECTROSURGICAL) ×1 IMPLANT
EXTRACTOR VACUUM M CUP 4 TUBE (SUCTIONS) IMPLANT
GAUZE SPONGE 4X4 12PLY STRL LF (GAUZE/BANDAGES/DRESSINGS) IMPLANT
GLOVE BIOGEL PI IND STRL 7.0 (GLOVE) ×3 IMPLANT
GLOVE ECLIPSE 7.0 STRL STRAW (GLOVE) ×1 IMPLANT
GOWN STRL REUS W/TWL LRG LVL3 (GOWN DISPOSABLE) ×2 IMPLANT
KIT ABG SYR 3ML LUER SLIP (SYRINGE) ×1 IMPLANT
NDL HYPO 25X5/8 SAFETYGLIDE (NEEDLE) ×1 IMPLANT
NEEDLE HYPO 22GX1.5 SAFETY (NEEDLE) ×1 IMPLANT
NEEDLE HYPO 25X5/8 SAFETYGLIDE (NEEDLE) ×1 IMPLANT
NS IRRIG 1000ML POUR BTL (IV SOLUTION) ×1 IMPLANT
PACK C SECTION WH (CUSTOM PROCEDURE TRAY) ×1 IMPLANT
PAD ABD 7.5X8 STRL (GAUZE/BANDAGES/DRESSINGS) ×1 IMPLANT
PAD ABD DERMACEA PRESS 5X9 (GAUZE/BANDAGES/DRESSINGS) IMPLANT
PAD OB MATERNITY 4.3X12.25 (PERSONAL CARE ITEMS) ×1 IMPLANT
RTRCTR C-SECT PINK 25CM LRG (MISCELLANEOUS) ×1 IMPLANT
STRIP CLOSURE SKIN 1/2X4 (GAUZE/BANDAGES/DRESSINGS) ×1 IMPLANT
SUT MNCRL 0 VIOLET CTX 36 (SUTURE) ×2 IMPLANT
SUT VIC AB 0 CTX36XBRD ANBCTRL (SUTURE) ×1 IMPLANT
SUT VIC AB 4-0 KS 27 (SUTURE) ×1 IMPLANT
SYR 30ML LL (SYRINGE) ×1 IMPLANT
TOWEL OR 17X24 6PK STRL BLUE (TOWEL DISPOSABLE) ×1 IMPLANT
TRAY FOLEY W/BAG SLVR 14FR LF (SET/KITS/TRAYS/PACK) ×1 IMPLANT
WATER STERILE IRR 1000ML POUR (IV SOLUTION) ×1 IMPLANT

## 2024-01-03 NOTE — Lactation Note (Signed)
 This note was copied from a baby's chart. Lactation Consultation Note  Patient Name: Adriana Decker [id number] Date: 01/03/2024 Age:23 hours Reason for consult: Initial assessment;Primapara;1st time breastfeeding;Early term 37-38.6wks;Other (Comment) (GHTN on MgSO4)  LC in to visit with P1 Mom of baby "Adriana Decker" born by emergency C/S for FTP and prolonged FHR decel.  Baby was evaluated by Neo twice.  Baby was fed 18 ml of formula by bottle at 3 hrs of life due to not latching to the breast for longer than 5 seconds per Mom.  Baby's temp was low this am 97.1 am.  When LC entered the room, the RN was trying to formula feed baby by bottle.  Baby was fussy and crying with a high pitched cry.  LC offered to help.  LC placed baby STS on Mom's chest, baby crying with a high pitched cry.  LC reviewed breast massage and hand expression, a drop of colostrum expressed.  LC reclined bed and with baby prone on Mom STS, LC moved baby onto breast and baby fussy and not cueing to eat.  LC did finger stimulation and baby did settle into a nice suck pattern.  Tried again at the breast, but baby became fussy and would not latch to the breast with LC assistance.  With Mom reclined partly in bed, and baby prone on Mom's chest, LC tried to stimulate baby's suck with bottle nipple and baby became fussy again and would suck.  Mom also tried.  Mom with soothing voice to calm baby, took about 15 mins and then baby settled into a peaceful sleep.  LC set up DEBP and sized Mom for 18 mm flanges.  Mom was on the phone with her Mom (long distance).  More teaching on pumping is needed.  Mom does not have a DEBP at home.  STORK pump requested.  Mom has not signed up for [facility].  Plan (written on dry erase board) 1- STS with baby 2- with feeding cues, offer the breast often, asking for help prn 3- If baby is supplemented with a bottle due to not latching, Mom to pump on initiation setting, asking for help from RN or  LC   Maternal Data Has patient been taught Hand Expression?: Yes Does the patient have breastfeeding experience prior to this delivery?: No  Feeding Mother's Current Feeding Choice: Breast Milk and Formula Nipple Type: Nfant Extra Slow Flow (gold)  LATCH Score Latch: Too sleepy or reluctant, no latch achieved, no sucking elicited.  Audible Swallowing: None  Type of Nipple: Everted at rest and after stimulation  Comfort (Breast/Nipple): Soft / non-tender  Hold (Positioning): Full assist, staff holds infant at breast  LATCH Score: 4   Lactation Tools Discussed/Used Tools: Pump;Flanges;Bottle Flange Size: 18 Breast pump type: Double-Electric Breast Pump Reason for Pumping: support milk supply/infant was supplemented Pumping frequency: Encouraged Mom to pump with next bottle supplementation to support her milk supply  Interventions Interventions: Breast feeding basics reviewed;Assisted with latch;Skin to skin;Breast massage;Hand express;Support pillows;Position options;DEBP;Education;LC Services brochure  Discharge Pump: Referral sent for [facility] Program: No  Consult Status Consult Status: Follow-up Date: 01/04/24 Follow-up type: In-patient    Dario Edison 01/03/2024, 11:02 AM

## 2024-01-03 NOTE — Plan of Care (Signed)
 Problem: Education: Goal: Knowledge of General Education information will improve Description: Including pain rating scale, medication(s)/side effects and non-pharmacologic comfort measures Outcome: Progressing   Problem: Health Behavior/Discharge Planning: Goal: Ability to manage health-related needs will improve Outcome: Progressing   Problem: Clinical Measurements: Goal: Ability to maintain clinical measurements within normal limits will improve Outcome: Progressing Goal: Will remain free from infection Outcome: Progressing Goal: Diagnostic test results will improve Outcome: Progressing Goal: Respiratory complications will improve Outcome: Progressing Goal: Cardiovascular complication will be avoided Outcome: Progressing   Problem: Activity: Goal: Risk for activity intolerance will decrease Outcome: Progressing   Problem: Nutrition: Goal: Adequate nutrition will be maintained Outcome: Progressing   Problem: Coping: Goal: Level of anxiety will decrease Outcome: Progressing   Problem: Elimination: Goal: Will not experience complications related to bowel motility Outcome: Progressing Goal: Will not experience complications related to urinary retention Outcome: Progressing   Problem: Pain Managment: Goal: General experience of comfort will improve and/or be controlled Outcome: Progressing   Problem: Safety: Goal: Ability to remain free from injury will improve Outcome: Progressing   Problem: Skin Integrity: Goal: Risk for impaired skin integrity will decrease Outcome: Progressing   Problem: Education: Goal: Knowledge of Childbirth will improve Outcome: Progressing Goal: Ability to make informed decisions regarding treatment and plan of care will improve Outcome: Progressing Goal: Ability to state and carry out methods to decrease the pain will improve Outcome: Progressing Goal: Individualized Educational Video(s) Outcome: Progressing   Problem:  Coping: Goal: Ability to verbalize concerns and feelings about labor and delivery will improve Outcome: Progressing   Problem: Life Cycle: Goal: Ability to make normal progression through stages of labor will improve Outcome: Progressing Goal: Ability to effectively push during vaginal delivery will improve Outcome: Progressing   Problem: Role Relationship: Goal: Will demonstrate positive interactions with the child Outcome: Progressing   Problem: Safety: Goal: Risk of complications during labor and delivery will decrease Outcome: Progressing   Problem: Pain Management: Goal: Relief or control of pain from uterine contractions will improve Outcome: Progressing   Problem: Education: Goal: Knowledge of disease or condition will improve Outcome: Progressing Goal: Knowledge of the prescribed therapeutic regimen will improve Outcome: Progressing   Problem: Fluid Volume: Goal: Peripheral tissue perfusion will improve Outcome: Progressing   Problem: Clinical Measurements: Goal: Complications related to disease process, condition or treatment will be avoided or minimized Outcome: Progressing   Problem: Education: Goal: Knowledge of the prescribed therapeutic regimen will improve Outcome: Progressing Goal: Understanding of sexual limitations or changes related to disease process or condition will improve Outcome: Progressing Goal: Individualized Educational Video(s) Outcome: Progressing   Problem: Self-Concept: Goal: Communication of feelings regarding changes in body function or appearance will improve Outcome: Progressing   Problem: Skin Integrity: Goal: Demonstration of wound healing without infection will improve Outcome: Progressing   Problem: Education: Goal: Knowledge of condition will improve Outcome: Progressing Goal: Individualized Educational Video(s) Outcome: Progressing Goal: Individualized Newborn Educational Video(s) Outcome: Progressing   Problem:  Activity: Goal: Will verbalize the importance of balancing activity with adequate rest periods Outcome: Progressing Goal: Ability to tolerate increased activity will improve Outcome: Progressing   Problem: Coping: Goal: Ability to identify and utilize available resources and services will improve Outcome: Progressing   Problem: Life Cycle: Goal: Chance of risk for complications during the postpartum period will decrease Outcome: Progressing   Problem: Role Relationship: Goal: Ability to demonstrate positive interaction with newborn will improve Outcome: Progressing   Problem: Skin Integrity: Goal: Demonstration of wound healing without infection  will improve Outcome: Progressing

## 2024-01-03 NOTE — Transfer of Care (Signed)
 Immediate Anesthesia Transfer of Care Note  Patient: Adriana Decker  Procedure(s) Performed: CESAREAN DELIVERY  Patient Location: PACU  Anesthesia Type:Epidural  Level of Consciousness: awake, alert , and oriented  Airway & Oxygen Therapy: Patient Spontanous Breathing  Post-op Assessment: Report given to RN and Post -op Vital signs reviewed and stable  Post vital signs: Reviewed and stable  Last Vitals:  Vitals Value Taken Time  BP 137/93 01/03/24 0420  Temp    Pulse 88 01/03/24 0424  Resp 19 01/03/24 0424  SpO2 98 % 01/03/24 0424  Vitals shown include unfiled device data.  Last Pain:  Vitals:   01/02/24 2345  TempSrc: Axillary  PainSc:          Complications: No notable events documented.

## 2024-01-03 NOTE — Progress Notes (Signed)
 Labor Progress Note Adriana Decker is a 23 y.o. G1P0 at [redacted]w[redacted]d presented for IOL due to SIPE  S: Uncomfortable w contractions. No other concerns.  O:  BP (!) 133/106   Pulse 77   Temp 98.1 F (36.7 C) (Axillary)   Resp 16   Ht 5\' 5"  (1.651 m)   Wt 128 kg   LMP 04/09/2023   SpO2 98%   BMI 46.96 kg/m  EFM: 125/mod/+a/-d  CVE: Dilation: 7 Effacement (%): 90 Station: -2 Presentation: Vertex Exam by:: [name], MD   A&P: 23 y.o. G1P0 [redacted]w[redacted]d here for IOL in setting of SIPE  #Labor: Stalled progress -- has been 6-7cm since 0347 (6.5/70/-2), discussed role of epidural in relaxing pelvic floor and see if this helps w dilation any. Discussed w pt that if unchanged on next check, would be considered arrest of dilation and would recommend proceeding with cesarean delivery. Pt understanding of plan. #Pain: Epidural #FWB: Cat I #GBS negative  #SIPE w SF: Cont Mag, BP have been normotensive to mild range  no sxs   [name], MD 12:21 AM

## 2024-01-03 NOTE — Discharge Summary (Signed)
 Postpartum Discharge Summary  Date of Service updated***     Patient Name: Adriana Decker DOB: Jan 15, 2001 MRN: [id number]  Date of admission: 01/01/2024 Delivery date:01/03/2024 Delivering provider: Levie Ream Date of discharge: 01/03/2024  Admitting diagnosis: Gestational hypertension, third trimester [O13.3] Intrauterine pregnancy: [redacted]w[redacted]d     Secondary diagnosis:  Principal Problem:   Status post primary low transverse cesarean section Active Problems:   Gestational hypertension, third trimester  Additional problems: ***    Discharge diagnosis: Term Pregnancy Delivered and Preeclampsia (severe)                                              Post partum procedures:{Postpartum procedures:23558} Augmentation: AROM, Pitocin, Cytotec, and IP Foley Complications: {OB Labor/Delivery Complications:20784}  Hospital course: Induction of Labor With Cesarean Section   23 y.o. yo G1P1001 at [redacted]w[redacted]d was admitted to the hospital 01/01/2024 for induction of labor. Patient had a labor course significant for arrest of dilation at 7cm and prolonged decelerations, at which point proceeded with cesarean delivery. The patient went for cesarean section due to Arrest of Dilation. Delivery details are as follows: Membrane Rupture Time/Date: 12:19 AM,01/02/2024  Delivery Method:C-Section, Low Transverse Operative Delivery:N/A Details of operation can be found in separate operative Note.  Patient had a postpartum course complicated by***. She is ambulating, tolerating a regular diet, passing flatus, and urinating well.  Patient is discharged home in stable condition on 01/03/24.      Newborn Data: Birth date:01/03/2024 Birth time:3:27 AM Gender:Female Living status:Living Apgars:5 ,8  Weight:3000 g                               Magnesium Sulfate received: Yes: Seizure prophylaxis BMZ received: No Rhophylac:N/A MMR:N/A T-DaP:Given prenatally Flu: Yes RSV Vaccine received:  No Transfusion:{Transfusion received:30440034}  Immunizations received: Immunization History  Administered Date(s) Administered   Tdap 11/04/2023    Physical exam  Vitals:   01/03/24 0435 01/03/24 0450 01/03/24 0454 01/03/24 0500  BP:   137/77 118/75  Pulse: 87 86 79 69  Resp: 17 19 18 18   Temp:  98.2 F (36.8 C)    TempSrc:  Oral    SpO2: 98% 96% 97% 97%  Weight:      Height:       General: {Exam; general:21111117} Lochia: {Desc; appropriate/inappropriate:30686::"appropriate"} Uterine Fundus: {Desc; firm/soft:30687} Incision: {Exam; incision:21111123} DVT Evaluation: {Exam; dvt:2111122} Labs: Lab Results  Component Value Date   WBC 14.9 (H) 01/02/2024   HGB 9.8 (L) 01/02/2024   HCT 33.1 (L) 01/02/2024   MCV 70.3 (L) 01/02/2024   PLT 322 01/02/2024      Latest Ref Rng & Units 01/02/2024    4:55 PM  CMP  Glucose 70 - 99 mg/dL 409   BUN 6 - 20 mg/dL 5   Creatinine 8.11 - 9.14 mg/dL 7.82   Sodium 956 - 213 mmol/L 134   Potassium 3.5 - 5.1 mmol/L 4.7   Chloride 98 - 111 mmol/L 103   CO2 22 - 32 mmol/L 21   Calcium 8.9 - 10.3 mg/dL 7.5   Total Protein 6.5 - 8.1 g/dL 6.0   Total Bilirubin 0.0 - 1.2 mg/dL 0.7   Alkaline Phos 38 - 126 U/L 122   AST 15 - 41 U/L 42   ALT 0 - 44 U/L 19  Edinburgh Score:     No data to display         No data recorded  After visit meds:  Allergies as of 01/03/2024       Reactions   No Known Allergies      Med Rec must be completed prior to using this [facility]***        Discharge home in stable condition Infant Feeding: {Baby feeding:23562} Infant Disposition:{CHL IP OB HOME WITH ZOXWRU:04540} Discharge instruction: per After Visit Summary and Postpartum booklet. Activity: Advance as tolerated. Pelvic rest for 6 weeks.  Diet: {OB JWJX:91478295} Future Appointments: Future Appointments  Date Time Provider Department Center  01/08/2024  2:30 PM Davis, Devon E, PA-C [facility] None   Follow up Visit: Message sent  to [facility] 5/24  Please schedule this patient for a In person postpartum visit in 6 weeks with the following provider: Any provider. Additional Postpartum F/U:Incision check 1 week and BP check 1 week  High risk pregnancy complicated by: HTN Delivery mode:  C-Section, Low Transverse Anticipated Birth Control:  None   01/03/2024 [name], MD

## 2024-01-03 NOTE — Progress Notes (Signed)
 Patient ID: Adriana Decker, female   DOB: 29-Apr-2001, 23 y.o.   MRN: [id number] Patient has been 7 cm for a long time. She is ruptured and on Pitocin. She has had a change in station, but no change in dilation. She had a prolonged decel x 4 minutes. We have discussed R/B/A of abdominal delivery and she would like to proceed with that.

## 2024-01-03 NOTE — Op Note (Signed)
 Preoperative Diagnosis:  IUP @ [redacted]w[redacted]d, arrest of dilation at 7cm, fetal intolerance to labor  Postoperative Diagnosis: Same  Procedure: Primary low transverse cesarean section  Surgeon: Tiffany Foerster, M.D.  Assistant: [name], MD An experienced assistant was required given the standard of surgical care given the complexity of the case.  This assistant was needed for exposure, dissection, suctioning, retraction, instrument exchange, assisting with delivery with administration of fundal pressure, and for overall help during the procedure.  Findings: Viable female infant, APGAR (1 MIN): 5  APGAR (5 MINS): 8  Weight pending occiput transverse position, cephalic presentation  Estimated blood loss: 716 cc  Total IV fluids: 1200cc  Total urine output: 150cc  Complications: None known  Specimens: Placenta to pathology  Reason for procedure: Briefly, the patient is a 23 y.o. G1P1001 [redacted]w[redacted]d who presented for induction of labor due top gestational hypertension. She subsequently developed pre-eclampsia with severe features. She had progressed to about 6-7cm dilation, then had arrest of dilation. She also subsequently had prolonged deceleration to 50s bpm for 4 min, at which point decision made to proceed with cesarean delivery.  Procedure: Patient is a to the OR where her epidural was dosed up to a surgical level. She was then placed in a supine position with left lateral tilt. She received 3 g of Ancef  and 500mg  of Azithromycin and SCDs were in place. A timeout was performed. She was prepped and draped in the usual sterile fashion. A Foley catheter was already in place in the bladder. A knife was then used to make a Pfannenstiel incision. This incision was carried out to underlying fascia which was divided in the midline bluntly. Attention was turned to the superior aspect of the fascial incision which was grasped with the kocher clamps x 2, tented up and the rectus muscles were dissected off  sharply with Mayo scissors. The rectus was divided in the midline.  The peritoneal cavity was entered bluntly.  Alexis retractor was placed inside the incision.  A knife was used to make a low transverse incision on the uterus. This incision was carried down to the amniotic cavity was entered. Fetus was in cephalic position and was brought up out of the incision without difficulty. Cord was clamped x 2 and cut. Infant taken to waiting nurse.  Cord blood and arterial cord gases were obtained. Placenta was delivered from the uterus.  Uterus was cleaned with dry lap pads. Uterine incision closed with 0 Monocryl suture in a running fashion. Good hemostasis was noted after closure of hysterotomy. Alexis retractor was removed from the abdomen. Peritoneal closure was done with 0 Monocryl suture.  Fascia is closed with 0 Vicryl suture in a running fashion. Subcutaneous tissue infused with 30cc 0.25% Marcaine .  Subcutaneous closure was performed with 0 plain suture.  Skin closed using 4-0 Vicryl on a Keith needle.  Steri strips applied, followed by pressure dressing.  All instrument, needle and lap counts were correct x 2.  Patient was awake and taken to PACU stable.  Infant remained with mom in couplet care, stable.   [name], MD OB Fellow, Faculty [facility], Center for [facility]  01/03/2024 5:15 AM

## 2024-01-04 LAB — CBC
HCT: 24 % — ABNORMAL LOW (ref 36.0–46.0)
Hemoglobin: 7 g/dL — ABNORMAL LOW (ref 12.0–15.0)
MCH: 21.1 pg — ABNORMAL LOW (ref 26.0–34.0)
MCHC: 29.2 g/dL — ABNORMAL LOW (ref 30.0–36.0)
MCV: 72.3 fL — ABNORMAL LOW (ref 80.0–100.0)
Platelets: 252 10*3/uL (ref 150–400)
RBC: 3.32 MIL/uL — ABNORMAL LOW (ref 3.87–5.11)
RDW: 19.7 % — ABNORMAL HIGH (ref 11.5–15.5)
WBC: 18.5 10*3/uL — ABNORMAL HIGH (ref 4.0–10.5)
nRBC: 0.2 % (ref 0.0–0.2)

## 2024-01-04 MED ORDER — IRON SUCROSE 500 MG IVPB - SIMPLE MED
500.0000 mg | Freq: Once | INTRAVENOUS | Status: DC
  Filled 2024-01-04: qty 275

## 2024-01-04 MED ORDER — SODIUM CHLORIDE 0.9 % IV SOLN
500.0000 mg | Freq: Once | INTRAVENOUS | Status: AC
  Administered 2024-01-04: 500 mg via INTRAVENOUS
  Filled 2024-01-04: qty 25

## 2024-01-04 MED ORDER — NIFEDIPINE ER OSMOTIC RELEASE 30 MG PO TB24
30.0000 mg | ORAL_TABLET | Freq: Two times a day (BID) | ORAL | Status: DC
  Administered 2024-01-04 – 2024-01-06 (×5): 30 mg via ORAL
  Filled 2024-01-04 (×5): qty 1

## 2024-01-04 MED ORDER — IRON SUCROSE 500 MG IVPB - SIMPLE MED: 500.0000 mg | Freq: Once | INTRAVENOUS | Status: DC

## 2024-01-04 NOTE — Anesthesia Postprocedure Evaluation (Signed)
 Anesthesia Post Note  Patient: Adriana Decker  Procedure(s) Performed: CESAREAN DELIVERY     Patient location during evaluation: PACU Anesthesia Type: Epidural Level of consciousness: patient cooperative and awake and alert Pain management: pain level controlled Vital Signs Assessment: post-procedure vital signs reviewed and stable Respiratory status: spontaneous breathing Cardiovascular status: stable Postop Assessment: epidural receding Anesthetic complications: no   No notable events documented.  Last Vitals:  Vitals:   01/04/24 0100 01/04/24 0432  BP: (!) 105/41 (!) 104/54  Pulse: (!) 52 (!) 55  Resp:  18  Temp:  36.6 C  SpO2:  98%    Last Pain:  Vitals:   01/04/24 0556  TempSrc:   PainSc: 4                  Gorman Laughter

## 2024-01-04 NOTE — Lactation Note (Signed)
 This note was copied from a baby's chart. Lactation Consultation Note  Patient Name: Adriana Decker ZOXWR'U Date: 01/04/2024 Age:23 hours Reason for consult: Follow-up assessment;Primapara;1st time breastfeeding;Early term 37-38.6wks;Difficult latch  LC in to visit with P1 Mom of ET infant "Adriana Decker" delivered by C/S.  Baby has been unable to latch to the breast even though Mom has been trying.  Baby just took 42 ml by bottle and is sleeping.  Mom has not pumped yet unfortunately.  LC asked Mom what her plan was and how LC could help her.  Mom responded she wants to breastfeed if she can.  LC provided Mom with a pumping top and assisted her to pump using the 18 mm flanges.  Mom taught to use the initiation setting and it would turn off after 15 mins.  Mom encouraged to massage breasts or hold baby STS during pumping.    Milk flowing with pumping.  Milk storage guidelines provided.  Mom to feed baby any EBM first or instead of formula and reviewed the benefits of EBM over formula.  Reviewed importance of washing the pump parts after collecting the EBM.  Reviewed this process with FOB, GMOB and Mom.  Plan recommended- 1- Hold baby STS often, looking for feeding cues 2- Offer the breast often with feeding cues, asking RN for help and she will call LC prn 3- If baby is supplemented by bottle due to not latching, or still hungry, Mom to pump both breasts on initiation setting for 15 mins.  [facility] referral faxed.  Mom does not have a DEBP at home.  Showed her how the pump parts make a hand pump.  Lactation Tools Discussed/Used Tools: Pump;Flanges;Hands-free pumping top;Bottle Flange Size: 18 Breast pump type: Double-Electric Breast Pump Pump Education: Setup, frequency, and cleaning;Milk Storage Reason for Pumping: support milk supply Pumping frequency: Encouraged Mom to pump after breastfeeding attempts or 8 times per 24 hrs Pumped volume: 5 mL  Interventions Interventions: Breast feeding  basics reviewed;Skin to skin;Breast massage;Hand express;Hand pump;DEBP;Education;CDC milk storage guidelines;CDC Guidelines for Breast Pump Cleaning  Discharge Discharge Education: Outpatient recommendation Pump: Manual;Referral sent for [facility] Program: No  Consult Status Consult Status: Follow-up Date: 01/05/24 Follow-up type: In-patient    Dario Edison 01/04/2024, 11:12 AM

## 2024-01-04 NOTE — Progress Notes (Signed)
 Subjective: Postpartum Day 1: Cesarean Delivery Patient reports incisional pain and no problems voiding.    Objective: Vital signs in last 24 hours: Temp:  [97.4 F (36.3 C)-98.2 F (36.8 C)] 98.1 F (36.7 C) (05/25 0945) Pulse Rate:  [50-72] 58 (05/25 0945) Resp:  [16-18] 16 (05/25 0945) BP: (96-118)/(39-62) 107/62 (05/25 0945) SpO2:  [93 %-100 %] 99 % (05/25 0945) Vitals:   01/03/24 2354 01/04/24 0100 01/04/24 0432 01/04/24 0945  BP: (!) 103/43 (!) 105/41 (!) 104/54 107/62  Pulse: (!) 56 (!) 52 (!) 55 (!) 58  Resp: 17  18 16   Temp: 97.8 F (36.6 C)  97.8 F (36.6 C) 98.1 F (36.7 C)  TempSrc: Oral  Oral Oral  SpO2: 96%  98% 99%  Weight:      Height:        Intake/Output Summary (Last 24 hours) at 01/04/2024 1104 Last data filed at 01/04/2024 0945 Gross per 24 hour  Intake 4650 ml  Output 1575 ml  Net 3075 ml    Physical Exam:  General: alert, cooperative, and appears stated age 62: appropriate Uterine Fundus: firm Incision: no significant drainage DVT Evaluation: No evidence of DVT seen on physical exam.  Recent Labs    01/03/24 0601 01/04/24 0433  HGB 8.9* 7.0*  HCT 29.9* 24.0*    Assessment/Plan: Status post Cesarean section. Doing well postoperatively.  Continue current care. Switch from labetalol to Procardia Continue Lasix plus potassium IV Venofer for hemoglobin of 7 DC Foley today.  Granville Layer, MD 01/04/2024, 11:03 AM

## 2024-01-04 NOTE — Plan of Care (Signed)
  Problem: Education: Goal: Knowledge of General Education information will improve Description: Including pain rating scale, medication(s)/side effects and non-pharmacologic comfort measures Outcome: Progressing   Problem: Health Behavior/Discharge Planning: Goal: Ability to manage health-related needs will improve Outcome: Progressing   Problem: Clinical Measurements: Goal: Ability to maintain clinical measurements within normal limits will improve Outcome: Progressing Goal: Will remain free from infection Outcome: Progressing Goal: Diagnostic test results will improve Outcome: Progressing Goal: Respiratory complications will improve Outcome: Progressing Goal: Cardiovascular complication will be avoided Outcome: Progressing   Problem: Activity: Goal: Risk for activity intolerance will decrease Outcome: Progressing   Problem: Nutrition: Goal: Adequate nutrition will be maintained Outcome: Progressing   Problem: Coping: Goal: Level of anxiety will decrease Outcome: Progressing   Problem: Elimination: Goal: Will not experience complications related to bowel motility Outcome: Progressing Goal: Will not experience complications related to urinary retention Outcome: Progressing   Problem: Pain Managment: Goal: General experience of comfort will improve and/or be controlled Outcome: Progressing   Problem: Safety: Goal: Ability to remain free from injury will improve Outcome: Progressing   Problem: Skin Integrity: Goal: Risk for impaired skin integrity will decrease Outcome: Progressing   Problem: Education: Goal: Knowledge of disease or condition will improve Outcome: Progressing Goal: Knowledge of the prescribed therapeutic regimen will improve Outcome: Progressing   Problem: Fluid Volume: Goal: Peripheral tissue perfusion will improve Outcome: Progressing   Problem: Clinical Measurements: Goal: Complications related to disease process, condition or treatment  will be avoided or minimized Outcome: Progressing   Problem: Education: Goal: Knowledge of the prescribed therapeutic regimen will improve Outcome: Progressing Goal: Understanding of sexual limitations or changes related to disease process or condition will improve Outcome: Progressing Goal: Individualized Educational Video(s) Outcome: Progressing   Problem: Self-Concept: Goal: Communication of feelings regarding changes in body function or appearance will improve Outcome: Progressing   Problem: Skin Integrity: Goal: Demonstration of wound healing without infection will improve Outcome: Progressing   Problem: Education: Goal: Knowledge of condition will improve Outcome: Progressing Goal: Individualized Educational Video(s) Outcome: Progressing Goal: Individualized Newborn Educational Video(s) Outcome: Progressing   Problem: Activity: Goal: Will verbalize the importance of balancing activity with adequate rest periods Outcome: Progressing Goal: Ability to tolerate increased activity will improve Outcome: Progressing   Problem: Coping: Goal: Ability to identify and utilize available resources and services will improve Outcome: Progressing   Problem: Life Cycle: Goal: Chance of risk for complications during the postpartum period will decrease Outcome: Progressing   Problem: Role Relationship: Goal: Ability to demonstrate positive interaction with newborn will improve Outcome: Progressing   Problem: Skin Integrity: Goal: Demonstration of wound healing without infection will improve Outcome: Progressing

## 2024-01-04 NOTE — Plan of Care (Signed)
 Problem: Education: Goal: Knowledge of General Education information will improve Description: Including pain rating scale, medication(s)/side effects and non-pharmacologic comfort measures Outcome: Progressing   Problem: Health Behavior/Discharge Planning: Goal: Ability to manage health-related needs will improve Outcome: Progressing   Problem: Clinical Measurements: Goal: Ability to maintain clinical measurements within normal limits will improve Outcome: Progressing Goal: Will remain free from infection Outcome: Progressing Goal: Diagnostic test results will improve Outcome: Progressing Goal: Respiratory complications will improve Outcome: Progressing Goal: Cardiovascular complication will be avoided Outcome: Progressing   Problem: Activity: Goal: Risk for activity intolerance will decrease Outcome: Progressing   Problem: Nutrition: Goal: Adequate nutrition will be maintained Outcome: Progressing   Problem: Coping: Goal: Level of anxiety will decrease Outcome: Progressing   Problem: Elimination: Goal: Will not experience complications related to bowel motility Outcome: Progressing Goal: Will not experience complications related to urinary retention Outcome: Progressing   Problem: Pain Managment: Goal: General experience of comfort will improve and/or be controlled Outcome: Progressing   Problem: Safety: Goal: Ability to remain free from injury will improve Outcome: Progressing   Problem: Skin Integrity: Goal: Risk for impaired skin integrity will decrease Outcome: Progressing   Problem: Education: Goal: Knowledge of Childbirth will improve Outcome: Progressing Goal: Ability to make informed decisions regarding treatment and plan of care will improve Outcome: Progressing Goal: Ability to state and carry out methods to decrease the pain will improve Outcome: Progressing Goal: Individualized Educational Video(s) Outcome: Progressing   Problem:  Coping: Goal: Ability to verbalize concerns and feelings about labor and delivery will improve Outcome: Progressing   Problem: Life Cycle: Goal: Ability to make normal progression through stages of labor will improve Outcome: Progressing Goal: Ability to effectively push during vaginal delivery will improve Outcome: Progressing   Problem: Role Relationship: Goal: Will demonstrate positive interactions with the child Outcome: Progressing   Problem: Safety: Goal: Risk of complications during labor and delivery will decrease Outcome: Progressing   Problem: Pain Management: Goal: Relief or control of pain from uterine contractions will improve Outcome: Progressing   Problem: Education: Goal: Knowledge of disease or condition will improve Outcome: Progressing Goal: Knowledge of the prescribed therapeutic regimen will improve Outcome: Progressing   Problem: Fluid Volume: Goal: Peripheral tissue perfusion will improve Outcome: Progressing   Problem: Clinical Measurements: Goal: Complications related to disease process, condition or treatment will be avoided or minimized Outcome: Progressing   Problem: Education: Goal: Knowledge of the prescribed therapeutic regimen will improve Outcome: Progressing Goal: Understanding of sexual limitations or changes related to disease process or condition will improve Outcome: Progressing Goal: Individualized Educational Video(s) Outcome: Progressing   Problem: Self-Concept: Goal: Communication of feelings regarding changes in body function or appearance will improve Outcome: Progressing   Problem: Skin Integrity: Goal: Demonstration of wound healing without infection will improve Outcome: Progressing   Problem: Education: Goal: Knowledge of condition will improve Outcome: Progressing Goal: Individualized Educational Video(s) Outcome: Progressing Goal: Individualized Newborn Educational Video(s) Outcome: Progressing   Problem:  Activity: Goal: Will verbalize the importance of balancing activity with adequate rest periods Outcome: Progressing Goal: Ability to tolerate increased activity will improve Outcome: Progressing   Problem: Coping: Goal: Ability to identify and utilize available resources and services will improve Outcome: Progressing   Problem: Life Cycle: Goal: Chance of risk for complications during the postpartum period will decrease Outcome: Progressing   Problem: Role Relationship: Goal: Ability to demonstrate positive interaction with newborn will improve Outcome: Progressing   Problem: Skin Integrity: Goal: Demonstration of wound healing without infection  will improve Outcome: Progressing

## 2024-01-05 ENCOUNTER — Encounter (HOSPITAL_COMMUNITY): Payer: Self-pay | Admitting: Family Medicine

## 2024-01-05 ENCOUNTER — Other Ambulatory Visit: Payer: Self-pay | Admitting: Obstetrics and Gynecology

## 2024-01-05 DIAGNOSIS — G8918 Other acute postprocedural pain: Secondary | ICD-10-CM

## 2024-01-05 LAB — TYPE AND SCREEN
ABO/RH(D): A POS
Antibody Screen: POSITIVE
Unit division: 0
Unit division: 0

## 2024-01-05 LAB — BPAM RBC
Blood Product Expiration Date: 202506122359
Blood Product Expiration Date: 202506122359
ISSUE DATE / TIME: 202505151506
Unit Type and Rh: 6200
Unit Type and Rh: 6200

## 2024-01-05 MED ORDER — FUROSEMIDE 40 MG PO TABS: 40.0000 mg | ORAL_TABLET | Freq: Every day | ORAL | 0 refills | Status: DC

## 2024-01-05 MED ORDER — NIFEDIPINE ER 30 MG PO TB24: 30.0000 mg | ORAL_TABLET | Freq: Two times a day (BID) | ORAL | 1 refills | Status: DC

## 2024-01-05 MED ORDER — OXYCODONE HCL 5 MG PO TABS: 5.0000 mg | ORAL_TABLET | ORAL | 0 refills | Status: DC | PRN

## 2024-01-05 MED ORDER — POTASSIUM CHLORIDE CRYS ER 20 MEQ PO TBCR: 20.0000 meq | EXTENDED_RELEASE_TABLET | Freq: Two times a day (BID) | ORAL | 1 refills | Status: DC

## 2024-01-05 MED ORDER — IBUPROFEN 600 MG PO TABS: 600.0000 mg | ORAL_TABLET | Freq: Four times a day (QID) | ORAL | 0 refills | Status: DC

## 2024-01-05 MED ORDER — OXYCODONE HCL 5 MG PO TABS
5.0000 mg | ORAL_TABLET | Freq: Four times a day (QID) | ORAL | 0 refills | Status: DC | PRN
Start: 2024-01-05 — End: 2024-01-05

## 2024-01-05 NOTE — Lactation Note (Addendum)
 This note was copied from a baby's chart. Lactation Consultation Note  Patient Name: Adriana Decker ZOXWR'U Date: 01/05/2024 Age:23 hours- Bilirubin 12.5 at 49 hours  Reason for consult: Follow-up assessment;Primapara;1st time breastfeeding;Early term 37-38.6wks;Difficult latch;Infant weight loss;Breastfeeding assistance (3 % weight loss) Per mom has been trying to latch and the baby sucks a few times and releases. Baby wide awake and acting hungry.  LC offered to assist to latch and mom receptive. LC assisted to latch on the left breast, football, and the baby sucks a few times and released.  Baby is expecting the quick flow like the bottle.  LC reviewed supply and demand, importance of consistent pumping around the clock to enhance the milk coming in, to keep trying with latching. LC recommended try feeding the baby and appetizer from the bottle and then latch once the milk is coming in and the baby probably will be more patient.  LC reviewed engorgement prevention and tx.  Mom plans to go buy a DEBP after D/C today.   Maternal Data Has patient been taught Hand Expression?: Yes Does the patient have breastfeeding experience prior to this delivery?: No  Feeding Mother's Current Feeding Choice: Breast Milk and Formula Nipple Type: Slow - flow  LATCH Score Latch: Repeated attempts needed to sustain latch, nipple held in mouth throughout feeding, stimulation needed to elicit sucking reflex.  Audible Swallowing: None  Type of Nipple: Everted at rest and after stimulation  Comfort (Breast/Nipple): Soft / non-tender  Hold (Positioning): Assistance needed to correctly position infant at breast and maintain latch.  LATCH Score: 6   Lactation Tools Discussed/Used Tools: Pump;Flanges Flange Size: 18 Breast pump type: Double-Electric Breast Pump Pump Education: Setup, frequency, and cleaning;Milk Storage  Interventions Interventions: Breast feeding basics reviewed;Assisted with  latch;Skin to skin;Breast massage;Hand express;Breast compression;Adjust position;Support pillows;Position options;Hand pump;DEBP;Education;Pace feeding;LC Services brochure;CDC milk storage guidelines;CDC Guidelines for Breast Pump Cleaning  Discharge Discharge Education: Engorgement and breast care;Warning signs for feeding baby;Outpatient recommendation;Other (comment) (once the milk comes in well. LC offered to request LC O/P in Epic and per mom will plan on calling.) Pump: Manual (per mom plans to go buy a DEBP, her insurance if not taken by [employer] program. [place]) WIC Program: No  Consult Status Consult Status: Complete Date: 01/05/24    Richarda Chance 01/05/2024, 8:51 AM

## 2024-01-05 NOTE — Progress Notes (Signed)
 Oxycodone  rx resent due to pharmacy issues

## 2024-01-06 ENCOUNTER — Other Ambulatory Visit (HOSPITAL_COMMUNITY): Payer: Self-pay

## 2024-01-06 MED ORDER — NIFEDIPINE ER OSMOTIC RELEASE 60 MG PO TB24
90.0000 mg | ORAL_TABLET | Freq: Every day | ORAL | Status: DC
  Administered 2024-01-07: 90 mg via ORAL
  Filled 2024-01-06: qty 1

## 2024-01-06 MED ORDER — POTASSIUM CHLORIDE CRYS ER 20 MEQ PO TBCR
20.0000 meq | EXTENDED_RELEASE_TABLET | Freq: Two times a day (BID) | ORAL | 1 refills | Status: AC
  Filled 2024-01-06: qty 10, 5d supply, fill #0

## 2024-01-06 MED ORDER — NIFEDIPINE ER 90 MG PO TB24
90.0000 mg | ORAL_TABLET | Freq: Every day | ORAL | 1 refills | Status: DC
Start: 2024-01-07 — End: 2024-01-08
  Filled 2024-01-06: qty 30, 30d supply, fill #0

## 2024-01-06 MED ORDER — OXYCODONE HCL 5 MG PO TABS
5.0000 mg | ORAL_TABLET | ORAL | 0 refills | Status: AC | PRN
  Filled 2024-01-06: qty 10, 2d supply, fill #0

## 2024-01-06 MED ORDER — IBUPROFEN 600 MG PO TABS
600.0000 mg | ORAL_TABLET | Freq: Four times a day (QID) | ORAL | 0 refills | Status: AC
  Filled 2024-01-06: qty 30, 7d supply, fill #0

## 2024-01-06 MED ORDER — NIFEDIPINE ER OSMOTIC RELEASE 60 MG PO TB24
60.0000 mg | ORAL_TABLET | Freq: Once | ORAL | Status: AC
  Administered 2024-01-06: 60 mg via ORAL
  Filled 2024-01-06: qty 1

## 2024-01-06 MED ORDER — SENNOSIDES-DOCUSATE SODIUM 8.6-50 MG PO TABS
2.0000 | ORAL_TABLET | Freq: Every day | ORAL | 0 refills | Status: AC
  Filled 2024-01-06: qty 30, 15d supply, fill #0

## 2024-01-06 MED ORDER — FUROSEMIDE 40 MG PO TABS
40.0000 mg | ORAL_TABLET | Freq: Every day | ORAL | 0 refills | Status: AC
  Filled 2024-01-06: qty 5, 5d supply, fill #0

## 2024-01-06 NOTE — Consult Note (Signed)
   Outpatient Lactation Booking Note  Visited parent to establish outpatient lactation care. Mom and baby are doing well and states baby prefers the right breast when latching. Baby cuing to eat and dad warming bottle of breast milk  to feed baby. Outpatient lactation consultants reviewed tips for latching to the left breast and demonstrated football hold. Discussed offering breast after paced bottle feeding, skin to skin, milk storage guidelines and outpatient lactation appointment details and support.   Parent accepted outpatient lactation care and agreed to be on the wait list stating cannot come in June 4th or 5th.   Future Appointments  Date Time Provider Department Center  01/08/2024 11:00 AM [facility] NURSE [facility] None  01/20/2024 11:30 AM [facility] CONSULTANT [facility]  02/12/2024 10:15 AM Almond Army, CNM [facility] None    Appointment instructions added to discharge instructions.  Melodi Sprung, IBCLC and [employer] for [facility]

## 2024-01-06 NOTE — Patient Instructions (Addendum)
 Your appointment with Outpatient Lactation is: Date: June 10 th, 2025 Time: 11:30 am Therapist[facility] (First Floor) [address] [place]  Check in under baby's name.  Please bring your baby hungry along with your pump and a bottle of either formula or expressed breast milk. Please also bring your pump flanges and we welcome support people! If you need lactation assistance before your appointment, please call [phone] and press 4 for lactation.

## 2024-01-06 NOTE — Progress Notes (Signed)
 POSTPARTUM PROGRESS NOTE  POD #3  Subjective:  Ravina Boeder is a 23 y.o. G1P1001 s/p pLTCS at [redacted]w[redacted]d. No acute events overnight. She reports she is doing well. She denies any problems with ambulating, voiding or po intake. Denies nausea or vomiting. She has passed flatus. Pain is well controlled.  Lochia is appropriate.  Objective: Blood pressure (!) 146/61, pulse 83, temperature 98.1 F (36.7 C), temperature source Oral, resp. rate 19, height 5\' 5"  (1.651 m), weight 128 kg, last menstrual period 04/09/2023, SpO2 98%, unknown if currently breastfeeding.  Physical Exam:  General: alert, cooperative and no distress Chest: no respiratory distress Heart: regular rate, distal pulses intact Uterine Fundus: firm, appropriately tender DVT Evaluation: No calf swelling or tenderness Extremities: chronic edema despite lasix Skin: warm, dry; incision clean/dry/intact w/ honeycomb dressing in place  Recent Labs    01/04/24 0433  HGB 7.0*  HCT 24.0*    Assessment/Plan: Kidada Ebarb is a 23 y.o. G1P1001 s/p pLTCS at [redacted]w[redacted]d for AoDilatation, Fetal Intolerance of Labor.  POD#3 - Doing welll; pain well controlled. H/H appropriate  Routine postpartum care  OOB, ambulated  Lovenox for VTE prophylaxis Acute Blood Loss Anemia, clinically significant: s/p venofer, asymptomatic Severe PreE (BP): currently on Lasix 40mg  every day, K  20meq BID (K 4.2 on 5/24), Procardia 30mg  XL BID, asymptomatic, still with consistent mild range pressures   Increased Procardia to 90mg  XL QD Contraception: None  Feeding: Breast  Dispo: Plan for discharge 5/27 if BP improved.   LOS: 5 days   Ebony Goldstein, MD OB Fellow  01/06/2024, 9:23 AM

## 2024-01-06 NOTE — Lactation Note (Signed)
 This note was copied from a baby's chart. Lactation Consultation Note  Patient Name: Adriana Decker [id number] Date: 01/06/2024 Age:23 hours  Reason for consult: Follow-up assessment;Early term 37-38.6wks;Primapara;1st time breastfeeding  P1, [redacted]w[redacted]d, post Mag, 121 grams above birth weight  Mother was receptive to follow up LC visit. Mother and baby for discharge today. Mother's milk is coming to volume and she pumped 105 ml in her last pumping session. She states she is latching baby and he prefers the right breast. She says she does not feel confident yet about breastfeeding and feeds baby expressed milk or formula after breastfeeding.   Offered latch assistance before going home. Mom to call if desires assistance. Baby just fed by breast and bottle within the hour and sleeping now. Discussed positioning, signs of milk transfer and establishing and maintaining milk production. Advised to offer the breast prior to bottle feeding and to feed baby her expressed milk before formula. If baby is fussy at breast, give a partial feed by bottle then latch when baby is calm.   Discussed the benefits of an OP Lactation appointment for further support, education and evaluation of breastfeeding. Mother was on Magnesium  for BP management and feels she was not able to take in a lot of information regarding breastfeeding. Referral made to [facility] OP Lactation for a breastfeeding consultation.  Mom made aware of O/P services, breastfeeding support groups, community resources, and our phone # for post-discharge questions.     Feeding Mother's Current Feeding Choice: Breast Milk and Formula Nipple Type: Slow - flow  LATCH Score  Not observed    Lactation Tools Discussed/Used Pumped volume: 105 mL Reviewed milk storage and preparation  Interventions Interventions: Breast feeding basics reviewed;Education  Discharge Discharge Education: Engorgement and breast care;Warning signs for feeding  baby;Outpatient recommendation;Outpatient Epic message sent Pump: Manual (mother reports she is getting a DEBP after discharge)  Consult Status Consult Status: Complete Date: 01/06/24    Esperanza Hedges 01/06/2024, 8:44 AM

## 2024-01-07 ENCOUNTER — Other Ambulatory Visit (HOSPITAL_COMMUNITY): Payer: Self-pay

## 2024-01-07 DIAGNOSIS — R7401 Elevation of levels of liver transaminase levels: Secondary | ICD-10-CM | POA: Diagnosis not present

## 2024-01-07 DIAGNOSIS — Z98891 History of uterine scar from previous surgery: Secondary | ICD-10-CM

## 2024-01-07 LAB — COMPREHENSIVE METABOLIC PANEL WITH GFR
ALT: 53 U/L — ABNORMAL HIGH (ref 0–44)
AST: 88 U/L — ABNORMAL HIGH (ref 15–41)
Albumin: 1.7 g/dL — ABNORMAL LOW (ref 3.5–5.0)
Alkaline Phosphatase: 229 U/L — ABNORMAL HIGH (ref 38–126)
Anion gap: 11 (ref 5–15)
BUN: 8 mg/dL (ref 6–20)
CO2: 24 mmol/L (ref 22–32)
Calcium: 8.4 mg/dL — ABNORMAL LOW (ref 8.9–10.3)
Chloride: 105 mmol/L (ref 98–111)
Creatinine, Ser: 0.69 mg/dL (ref 0.44–1.00)
GFR, Estimated: >60 mL/min (ref >60)
Glucose, Bld: 104 mg/dL — ABNORMAL HIGH (ref 70–99)
Potassium: 4.2 mmol/L (ref 3.5–5.1)
Sodium: 140 mmol/L (ref 135–145)
Total Bilirubin: 0.4 mg/dL (ref 0.0–1.2)
Total Protein: 5.3 g/dL — ABNORMAL LOW (ref 6.5–8.1)

## 2024-01-07 LAB — CBC
HCT: 25.7 % — ABNORMAL LOW (ref 36.0–46.0)
Hemoglobin: 7.6 g/dL — ABNORMAL LOW (ref 12.0–15.0)
MCH: 22.1 pg — ABNORMAL LOW (ref 26.0–34.0)
MCHC: 29.6 g/dL — ABNORMAL LOW (ref 30.0–36.0)
MCV: 74.7 fL — ABNORMAL LOW (ref 80.0–100.0)
Platelets: 325 10*3/uL (ref 150–400)
RBC: 3.44 MIL/uL — ABNORMAL LOW (ref 3.87–5.11)
RDW: 20.6 % — ABNORMAL HIGH (ref 11.5–15.5)
WBC: 12 10*3/uL — ABNORMAL HIGH (ref 4.0–10.5)
nRBC: 0.7 % — ABNORMAL HIGH (ref 0.0–0.2)

## 2024-01-07 LAB — SURGICAL PATHOLOGY

## 2024-01-07 MED ORDER — IBUPROFEN 600 MG PO TABS
600.0000 mg | ORAL_TABLET | Freq: Four times a day (QID) | ORAL | 0 refills | Status: AC | PRN
  Filled 2024-01-07: qty 30, 8d supply, fill #0

## 2024-01-07 MED ORDER — FERROUS SULFATE 325 (65 FE) MG PO TABS
325.0000 mg | ORAL_TABLET | ORAL | Status: DC
  Administered 2024-01-07: 325 mg via ORAL
  Filled 2024-01-07: qty 1

## 2024-01-07 MED ORDER — IBUPROFEN 600 MG PO TABS
600.0000 mg | ORAL_TABLET | Freq: Four times a day (QID) | ORAL | Status: DC | PRN
  Administered 2024-01-07 – 2024-01-08 (×3): 600 mg via ORAL
  Filled 2024-01-07 (×3): qty 1

## 2024-01-07 MED ORDER — LABETALOL HCL 200 MG PO TABS
200.0000 mg | ORAL_TABLET | Freq: Two times a day (BID) | ORAL | Status: DC
  Administered 2024-01-07 – 2024-01-08 (×4): 200 mg via ORAL
  Filled 2024-01-07 (×4): qty 1

## 2024-01-07 MED ORDER — LABETALOL HCL 200 MG PO TABS
200.0000 mg | ORAL_TABLET | Freq: Two times a day (BID) | ORAL | 1 refills | Status: AC
  Filled 2024-01-07: qty 60, 30d supply, fill #0

## 2024-01-07 NOTE — Progress Notes (Addendum)
 Daily Postpartum Note  Admission Date: 01/01/2024 Current Date: 01/07/2024 8:30 AM  Adriana Decker is a 23 y.o. G1P1001 POD#4 s/p PLTCS at 38/3 for arrest of dilation; patient admitted for severe pre-eclampsia based on BPs  Pregnancy complicated by: Patient Active Problem List   Diagnosis Date Noted   Transaminitis 01/07/2024   Status post primary low transverse cesarean section 01/03/2024   Gestational hypertension, third trimester 01/01/2024   Anemia in pregnancy 10/07/2023   Excessive weight gain during pregnancy, antepartum 10/07/2023   Pregnancy complicated by obesity 09/17/2023   Encounter for supervision of normal first pregnancy in first trimester 06/18/2023   Overnight/24hr events:  BP meds adjusted, labetalol added on o/n  Subjective:  No s/s of pre-eclampsia, +flatus and BMs, no s/s of anemia.   Objective:   T 97.9 F (36.6 C) Temp  Avg: 98.2 F (36.8 C)  Min: 97.9 F (36.6 C)  Max: 98.6 F (37 C)  BP 122/63 BP  Min: 122/63  Max: 150/66  HR 86 Pulse  Avg: 80.7  Min: 73  Max: 88  RR 17 Resp  Avg: 17.5  Min: 16  Max: 19  SaO2 98 % Room Air SpO2  Avg: 98.2 %  Min: 96 %  Max: 100 %       24 Hour I/O Current Shift I/O  Time Ins Outs No intake/output data recorded. No intake/output data recorded.   Patient Vitals for the past 24 hrs:  BP Temp Temp src Pulse Resp SpO2  01/07/24 0813 122/63 97.9 F (36.6 C) Oral 86 17 98 %  01/07/24 0444 131/64 98.2 F (36.8 C) Oral 77 16 96 %  01/07/24 0010 (!) 150/66 98 F (36.7 C) Oral 88 18 97 %  01/06/24 2013 (!) 150/80 98.6 F (37 C) Oral 81 19 --  01/06/24 1615 (!) 149/77 98 F (36.7 C) Oral 79 17 100 %  01/06/24 1145 (!) 145/69 98.4 F (36.9 C) Oral 73 18 100 %   Physical exam: General: Well nourished, well developed female in no acute distress. Abdomen: firm fundus below the umbilicus. Dressing c/d/I. +BS Cardiovascular: S1, S2 normal, no murmur, rub or gallop, regular rate and rhythm Respiratory:  CTAB Extremities: no clubbing, cyanosis or edema Skin: Warm and dry.   Medications: Current Facility-Administered Medications  Medication Dose Route Frequency Provider Last Rate Last Admin   coconut oil  1 Application Topical PRN Kumar, Agnijita, MD       witch hazel-glycerin (TUCKS) pad 1 Application  1 Application Topical PRN [name], MD       And   dibucaine (NUPERCAINAL) 1 % rectal ointment 1 Application  1 Application Rectal PRN Kumar, Agnijita, MD       diphenhydrAMINE (BENADRYL) injection 12.5 mg  12.5 mg Intravenous Q4H PRN Gorman Laughter, MD       Or   diphenhydrAMINE (BENADRYL) capsule 25 mg  25 mg Oral Q4H PRN Gorman Laughter, MD   25 mg at 01/06/24 0102   diphenhydrAMINE (BENADRYL) capsule 25 mg  25 mg Oral Q6H PRN Kumar, Agnijita, MD       enoxaparin (LOVENOX) injection 65 mg  65 mg Subcutaneous Q24H Kumar, Agnijita, MD   65 mg at 01/06/24 0759   furosemide (LASIX) tablet 40 mg  40 mg Oral Daily Kumar, Agnijita, MD   40 mg at 01/06/24 1009   gabapentin (NEURONTIN) capsule 200 mg  200 mg Oral QHS Kumar, Agnijita, MD   200 mg at 01/06/24 2238   guaiFENesin (MUCINEX)  12 hr tablet 600 mg  600 mg Oral BID PRN Kumar, Agnijita, MD   600 mg at 01/03/24 2308   labetalol (NORMODYNE) injection 20 mg  20 mg Intravenous PRN Kumar, Agnijita, MD   20 mg at 01/01/24 2045   And   labetalol (NORMODYNE) injection 40 mg  40 mg Intravenous PRN Kumar, Agnijita, MD   40 mg at 01/01/24 2009   And   labetalol (NORMODYNE) injection 80 mg  80 mg Intravenous PRN Kumar, Agnijita, MD       And   hydrALAZINE (APRESOLINE) injection 10 mg  10 mg Intravenous PRN Kumar, Agnijita, MD       ibuprofen  (ADVIL ) tablet 600 mg  600 mg Oral Q6H PRN Raynell Caller, MD       labetalol (NORMODYNE) tablet 200 mg  200 mg Oral BID Raynell Caller, MD   200 mg at 01/07/24 0236   magnesium hydroxide (MILK OF MAGNESIA) suspension 30 mL  30 mL Oral Q3 days PRN Kumar, Agnijita, MD       menthol-cetylpyridinium  (CEPACOL) lozenge 3 mg  1 lozenge Oral Q2H PRN Kumar, Agnijita, MD       naloxone (NARCAN) injection 0.4 mg  0.4 mg Intravenous PRN Gorman Laughter, MD       And   sodium chloride  flush (NS) 0.9 % injection 3 mL  3 mL Intravenous PRN Gorman Laughter, MD   3 mL at 01/05/24 0007   naloxone HCl (NARCAN) 2 mg in dextrose  5 % 250 mL infusion  1-2 mcg/kg/hr Intravenous Continuous PRN Gorman Laughter, MD       NIFEdipine (PROCARDIA-XL/NIFEDICAL-XL) 24 hr tablet 90 mg  90 mg Oral Daily Ebony Goldstein, MD       ondansetron  (ZOFRAN ) injection 4 mg  4 mg Intravenous Q8H PRN Gorman Laughter, MD       oxyCODONE  (Oxy IR/ROXICODONE ) immediate release tablet 5-10 mg  5-10 mg Oral Q6H PRN Kumar, Agnijita, MD   5 mg at 01/06/24 0820   potassium chloride SA (KLOR-CON M) CR tablet 20 mEq  20 mEq Oral BID Kumar, Agnijita, MD   20 mEq at 01/06/24 2239   prenatal multivitamin tablet 1 tablet  1 tablet Oral Q1200 Kumar, Agnijita, MD   1 tablet at 01/06/24 1241   senna-docusate (Senokot-S) tablet 2 tablet  2 tablet Oral Daily Kumar, Agnijita, MD   2 tablet at 01/06/24 1009   simethicone (MYLICON) chewable tablet 80 mg  80 mg Oral TID PC Kumar, Agnijita, MD   80 mg at 01/07/24 0827   simethicone (MYLICON) chewable tablet 80 mg  80 mg Oral PRN [name], MD       sodium chloride  flush (NS) 0.9 % injection 3-10 mL  3-10 mL Intravenous Q12H [name], MD   3 mL at 01/05/24 7829   sodium chloride  flush (NS) 0.9 % injection 3-10 mL  3-10 mL Intravenous PRN [name], MD       Labs:  Recent Labs  Lab 01/03/24 0601 01/04/24 0433 01/07/24 0644  WBC 22.8* 18.5* 12.0*  HGB 8.9* 7.0* 7.6*  HCT 29.9* 24.0* 25.7*  PLT 315 252 325    Recent Labs  Lab 01/02/24 1655 01/03/24 0601 01/07/24 0644  NA 134* 133* 140  K 4.7 4.2 4.2  CL 103 99 105  CO2 21* 20* 24  BUN 5* 9 8  CREATININE 0.79 0.95 0.69  CALCIUM 7.5* 7.1* 8.4*  PROT 6.0* 5.1* 5.3*  BILITOT 0.7 0.8 0.4  ALKPHOS 122  103 229*  ALT 19 16 53*   AST 42* 32 88*  GLUCOSE 103* 99 104*    Radiology:  No new imaging  Assessment & Plan:  Patient stable *Postpartum:routine care. A pos/circ (done)/unsure about birth control/breast *Severe pre-eclampsia: continue current regimen. AST/ALT not quite 2.5x ULN. Will rpt labs at noon. I d/c'ed scheduled apap, pt on oxycodone . *PPx: lovenox, OOB ad lit *FEN/GI: regular diet, saline lock IV *Dispo: tomorrow at the earliest.   Tyler Gallant MD Attending Center for [facility] (Faculty Practice) GYN Consult Phone: [phone] (M-F, [phone]) & [phone]  (Off hours, weekends, holidays)  ADDENDUM: CMP came back and elevated AST/ALT. Will repeat labs at noon  Raynell Caller, Marieta Shorten MD Attending Center for [facility] (Faculty Practice) 01/07/2024 Time: [phone]

## 2024-01-07 NOTE — Progress Notes (Signed)
 Talked to patient about her elevated LFTs, she desires to stay one more day for BP monitoring and recheck labs in morning.  Lab repeat time changed to 01/08/24 morning. She has no other concerning symptoms.  Her RN was notified.  Will continue close observation.  Lenoard Rad, MD

## 2024-01-08 ENCOUNTER — Other Ambulatory Visit (HOSPITAL_COMMUNITY): Payer: Self-pay

## 2024-01-08 ENCOUNTER — Encounter: Admitting: Physician Assistant

## 2024-01-08 ENCOUNTER — Ambulatory Visit

## 2024-01-08 LAB — CBC
HCT: 28.1 % — ABNORMAL LOW (ref 36.0–46.0)
Hemoglobin: 8 g/dL — ABNORMAL LOW (ref 12.0–15.0)
MCH: 21.7 pg — ABNORMAL LOW (ref 26.0–34.0)
MCHC: 28.5 g/dL — ABNORMAL LOW (ref 30.0–36.0)
MCV: 76.2 fL — ABNORMAL LOW (ref 80.0–100.0)
Platelets: 342 10*3/uL (ref 150–400)
RBC: 3.69 MIL/uL — ABNORMAL LOW (ref 3.87–5.11)
RDW: 21.7 % — ABNORMAL HIGH (ref 11.5–15.5)
WBC: 11.4 10*3/uL — ABNORMAL HIGH (ref 4.0–10.5)
nRBC: 0.7 % — ABNORMAL HIGH (ref 0.0–0.2)

## 2024-01-08 LAB — COMPREHENSIVE METABOLIC PANEL WITH GFR
ALT: 48 U/L — ABNORMAL HIGH (ref 0–44)
AST: 60 U/L — ABNORMAL HIGH (ref 15–41)
Albumin: 1.8 g/dL — ABNORMAL LOW (ref 3.5–5.0)
Alkaline Phosphatase: 202 U/L — ABNORMAL HIGH (ref 38–126)
Anion gap: 7 (ref 5–15)
BUN: 10 mg/dL (ref 6–20)
CO2: 24 mmol/L (ref 22–32)
Calcium: 8.4 mg/dL — ABNORMAL LOW (ref 8.9–10.3)
Chloride: 107 mmol/L (ref 98–111)
Creatinine, Ser: 0.66 mg/dL (ref 0.44–1.00)
GFR, Estimated: >60 mL/min (ref >60)
Glucose, Bld: 79 mg/dL (ref 70–99)
Potassium: 4.5 mmol/L (ref 3.5–5.1)
Sodium: 138 mmol/L (ref 135–145)
Total Bilirubin: 0.4 mg/dL (ref 0.0–1.2)
Total Protein: 5.8 g/dL — ABNORMAL LOW (ref 6.5–8.1)

## 2024-01-08 MED ORDER — NIFEDIPINE ER 60 MG PO TB24
60.0000 mg | ORAL_TABLET | Freq: Two times a day (BID) | ORAL | 1 refills | Status: AC
  Filled 2024-01-08: qty 60, 30d supply, fill #0

## 2024-01-08 NOTE — Discharge Summary (Signed)
 Postpartum Discharge Summary      Patient Name: Adriana Decker DOB: 2001/06/20 MRN: [id number]  Date of admission: 01/01/2024 Delivery date:01/03/2024 Delivering provider: Levie Ream Date of discharge: 01/08/2024  Admitting diagnosis: Gestational hypertension, third trimester [O13.3] Intrauterine pregnancy: [redacted]w[redacted]d     Secondary diagnosis:  Principal Problem:   Status post primary low transverse cesarean section Active Problems:   Gestational hypertension, third trimester   Transaminitis  Additional problems: preeclampsia with severe features    Discharge diagnosis: Term Pregnancy Delivered and Preeclampsia (severe)                                              Post partum procedures:IV iron Complications: None  Hospital course: Induction of Labor With Cesarean Section   23 y.o. yo G1P1001 at [redacted]w[redacted]d was admitted to the hospital 01/01/2024 for induction of labor. Patient had a labor course significant for preeclampsia with severe features. The patient went for cesarean section due to Arrest of Dilation at 7 cm. Delivery details are as follows: Membrane Rupture Time/Date: 12:19 AM,01/02/2024  Delivery Method:C-Section, Low Transverse Operative Delivery:N/A Details of operation can be found in separate operative Note.  Patient had a postpartum course complicated by need for magnesium sulfate for seizure prophylaxis and transaminitis which improved on day of discharge. She is ambulating, tolerating a regular diet, passing flatus, and urinating well.  Patient is discharged home in stable condition on 01/08/24, on procardia, lasiz and labetalol. Discharge instructions and precautions reviewed with the patient.      Newborn Data: Birth date:01/03/2024 Birth time:3:27 AM Gender:Female Living status:Living Apgars:5 ,8  Weight:3000 g                               Magnesium Sulfate received: Yes: Seizure prophylaxis BMZ received: No Rhophylac:N/A MMR:N/A T-DaP:Given  prenatally Flu: Yes RSV Vaccine received: No Transfusion:No  Immunizations received: Immunization History  Administered Date(s) Administered   Tdap 11/04/2023    Physical exam  Vitals:   01/08/24 0502 01/08/24 0808 01/08/24 0809 01/08/24 0811  BP: (!) 145/81  (!) 152/90 (!) 146/91  Pulse: 70  84 79  Resp: 18 18    Temp: 98 F (36.7 C) 97.9 F (36.6 C)    TempSrc: Oral Oral    SpO2: 100% 100%    Weight:      Height:       General: alert, cooperative, and no distress Lochia: appropriate Uterine Fundus: firm Incision: Dressing is clean, dry, and intact DVT Evaluation: No evidence of DVT seen on physical exam. Labs: Lab Results  Component Value Date   WBC 11.4 (H) 01/08/2024   HGB 8.0 (L) 01/08/2024   HCT 28.1 (L) 01/08/2024   MCV 76.2 (L) 01/08/2024   PLT 342 01/08/2024      Latest Ref Rng & Units 01/08/2024    6:13 AM  CMP  Glucose 70 - 99 mg/dL 79   BUN 6 - 20 mg/dL 10   Creatinine 1.30 - 1.00 mg/dL 8.65   Sodium 784 - 696 mmol/L 138   Potassium 3.5 - 5.1 mmol/L 4.5   Chloride 98 - 111 mmol/L 107   CO2 22 - 32 mmol/L 24   Calcium 8.9 - 10.3 mg/dL 8.4   Total Protein 6.5 - 8.1 g/dL 5.8   Total Bilirubin 0.0 -  1.2 mg/dL 0.4   Alkaline Phos 38 - 126 U/L 202   AST 15 - 41 U/L 60   ALT 0 - 44 U/L 48    Edinburgh Score:    01/04/2024    1:41 AM  Edinburgh Postnatal Depression Scale Screening Tool  I have been able to laugh and see the funny side of things. 0  I have looked forward with enjoyment to things. 0  I have blamed myself unnecessarily when things went wrong. 1  I have been anxious or worried for no good reason. 1  I have felt scared or panicky for no good reason. 1  Things have been getting on top of me. 1  I have been so unhappy that I have had difficulty sleeping. 1  I have felt sad or miserable. 1  I have been so unhappy that I have been crying. 0  The thought of harming myself has occurred to me. 0  Edinburgh Postnatal Depression Scale Total  6   No data recorded  After visit meds:  Allergies as of 01/08/2024       Reactions   No Known Allergies         Medication List     STOP taking these medications    aspirin  EC 81 MG tablet       TAKE these medications    ACCRUFeR  30 MG Caps Generic drug: Ferric Maltol  Take 1 capsule (30 mg total) by mouth 2 (two) times daily.   acetaminophen  500 MG tablet Commonly known as: TYLENOL  Take 500 mg by mouth every 6 (six) hours as needed.   Blood Pressure Kit Devi 1 kit by Does not apply route once a week.   furosemide 40 MG tablet Commonly known as: LASIX Take 1 tablet (40 mg total) by mouth daily for 5 days.   Gojji Weight Scale Misc 1 Device by Does not apply route every 30 (thirty) days.   ibuprofen  600 MG tablet Commonly known as: ADVIL  Take 1 tablet (600 mg total) by mouth every 6 (six) hours.   ibuprofen  600 MG tablet Commonly known as: ADVIL  Take 1 tablet (600 mg total) by mouth every 6 (six) hours as needed for fever, headache or mild pain (pain score 1-3).   labetalol 200 MG tablet Commonly known as: NORMODYNE Take 1 tablet (200 mg total) by mouth 2 (two) times daily.   NIFEdipine 60 MG 24 hr tablet Commonly known as: ADALAT CC Take 1 tablet (60 mg total) by mouth 2 (two) times daily.   ondansetron  4 MG tablet Commonly known as: Zofran  Take 1 tablet (4 mg total) by mouth every 8 (eight) hours as needed for nausea or vomiting.   oxyCODONE  5 MG immediate release tablet Commonly known as: Oxy IR/ROXICODONE  Take 1 tablet (5 mg total) by mouth every 4 (four) hours as needed (breakthrough pain).   potassium chloride SA 20 MEQ tablet Commonly known as: KLOR-CON M Take 1 tablet (20 mEq total) by mouth 2 (two) times daily for 5 days.   Senna-S 8.6-50 MG tablet Generic drug: senna-docusate Take 2 tablets by mouth daily.   Vitafol  Ultra 29-0.6-0.4-200 MG Caps Take 1 capsule by mouth daily.         Discharge home in stable condition Infant  Feeding: Bottle Infant Disposition:home with mother Discharge instruction: per After Visit Summary and Postpartum booklet. Activity: Advance as tolerated. Pelvic rest for 6 weeks.  Diet: routine diet Future Appointments: Future Appointments  Date Time Provider Department Center  01/08/2024 11:00 AM [facility] NURSE [facility] None  01/20/2024 11:30 AM [facility] CONSULTANT [facility]  02/12/2024 10:15 AM Almond Army, CNM [facility] None   Follow up Visit:  Follow-up Information     [facility] for [facility] Follow up in 1 week(s).   Specialty: Obstetrics and Gynecology Why: postop check, Hospital follow-up, they will call you with an appointment Contact information: [address], [address] [place]  [address] [phone]                  01/08/2024 Verlyn Goad, MD

## 2024-01-14 ENCOUNTER — Telehealth: Payer: Self-pay | Admitting: Lactation Services

## 2024-01-14 ENCOUNTER — Telehealth (HOSPITAL_COMMUNITY): Payer: Self-pay | Admitting: *Deleted

## 2024-01-14 NOTE — Telephone Encounter (Signed)
 Called mother and she is still at work. Will reach out to patient. Spoke with female and he reports patient is not available and will give a message. Phone number left for patient to call back as able.

## 2024-01-14 NOTE — Telephone Encounter (Signed)
 01/14/2024  Name: Adriana Decker MRN: [id number] DOB: 01/02/2001  Reason for Call:  Transition of Care Hospital Discharge Call  Contact Status: Patient Contact Status: Complete  Language assistant needed: Interpreter Mode: Interpreter Not Needed        Follow-Up Questions: Do You Have Any Concerns About Your Health As You Heal From Delivery?: Yes What Concerns Do You Have About Your Health?: Wonders what to expect with her internal healing.  Discussed healing after a cesarean, activity considerations, and parameters for calling provider. Do You Have Any Concerns About Your Infants Health?: No  Edinburgh Postnatal Depression Scale:  In the Past 7 Days: I have been able to laugh and see the funny side of things.: As much as I always could I have looked forward with enjoyment to things.: As much as I ever did I have blamed myself unnecessarily when things went wrong.: Not very often I have been anxious or worried for no good reason.: Yes, sometimes I have felt scared or panicky for no good reason.: No, not much Things have been getting on top of me.: No, most of the time I have coped quite well I have been so unhappy that I have had difficulty sleeping.: Not very often I have felt sad or miserable.: Not very often I have been so unhappy that I have been crying.: Only occasionally The thought of harming myself has occurred to me.: Never Edinburgh Postnatal Depression Scale Total: 8  PHQ2-9 Depression Scale:     Discharge Follow-up: Edinburgh score requires follow up?: No Patient was advised of the following resources:: Support Group, Breastfeeding Support Group  Post-discharge interventions: Reviewed Newborn Safe Sleep Practices  Pearlie Bougie, RN 01/14/2024 15:17

## 2024-01-15 ENCOUNTER — Ambulatory Visit

## 2024-01-15 DIAGNOSIS — Z0130 Encounter for examination of blood pressure without abnormal findings: Secondary | ICD-10-CM

## 2024-01-15 NOTE — Progress Notes (Signed)
 Subjective:  Adriana Decker is a 23 y.o. female here for BP check and incision check.  Hypertension ROS: taking medications as instructed, no medication side effects noted, no TIA's, no chest pain on exertion, no dyspnea on exertion, and no swelling of ankles.   Pt reports incision healing well  Objective:  BP 110/71   Pulse 72   LMP 04/09/2023   Breastfeeding Yes   Appearance alert, well appearing, and in no distress. Incision healing well   Assessment:   Blood Pressure today in office well controlled.  Incision healed nicely, no signs of infection, scar looks good  Plan:  Current treatment plan is effective, no change in therapy.Aaron Aas

## 2024-01-19 ENCOUNTER — Telehealth: Payer: Self-pay | Admitting: Lactation Services

## 2024-01-19 NOTE — Telephone Encounter (Signed)
 LC called to remind lactating parent of baby [place] appointment on   Future Appointments  Date Time Provider Department Center  01/20/2024 11:30 AM [facility] CONSULTANT [facility]  02/12/2024 10:15 AM Almond Army, CNM [facility] None    Parent answered? No Message was left Yes  LC reminded lactating parent to bring baby hungry, a bottle of milk, their pump, and a support person is always welcome. Reminded parent of contact information to call if anything changes [phone].    Melodi Sprung, [facility] Center for [facility]

## 2024-01-20 ENCOUNTER — Encounter

## 2024-02-12 ENCOUNTER — Ambulatory Visit: Admitting: Obstetrics and Gynecology
